# Patient Record
Sex: Female | Born: 1945 | Race: White | Hispanic: No | Marital: Married | Smoking: Never smoker
Health system: Southern US, Community
[De-identification: ages and names within clinical notes are randomized; demographics above are authoritative.]

## PROBLEM LIST (undated history)

## (undated) DIAGNOSIS — H269 Unspecified cataract: Secondary | ICD-10-CM

## (undated) DIAGNOSIS — E785 Hyperlipidemia, unspecified: Secondary | ICD-10-CM

## (undated) DIAGNOSIS — M8580 Other specified disorders of bone density and structure, unspecified site: Secondary | ICD-10-CM

## (undated) HISTORY — DX: Hyperlipidemia, unspecified: E78.5

## (undated) HISTORY — DX: Other specified disorders of bone density and structure, unspecified site: M85.80

## (undated) HISTORY — DX: Unspecified cataract: H26.9

---

## 2004-09-05 HISTORY — PX: BREAST EXCISIONAL BIOPSY: SUR124

## 2006-12-12 ENCOUNTER — Encounter: Admission: RE | Admit: 2006-12-12 | Discharge: 2006-12-12 | Payer: Self-pay | Admitting: Obstetrics and Gynecology

## 2007-12-14 ENCOUNTER — Encounter: Admission: RE | Admit: 2007-12-14 | Discharge: 2007-12-14 | Payer: Self-pay | Admitting: Obstetrics and Gynecology

## 2008-12-15 ENCOUNTER — Encounter: Admission: RE | Admit: 2008-12-15 | Discharge: 2008-12-15 | Payer: Self-pay | Admitting: Obstetrics and Gynecology

## 2009-12-16 ENCOUNTER — Encounter: Admission: RE | Admit: 2009-12-16 | Discharge: 2009-12-16 | Payer: Self-pay | Admitting: Obstetrics and Gynecology

## 2010-11-17 ENCOUNTER — Other Ambulatory Visit: Payer: Self-pay | Admitting: Obstetrics and Gynecology

## 2010-11-17 DIAGNOSIS — Z1231 Encounter for screening mammogram for malignant neoplasm of breast: Secondary | ICD-10-CM

## 2010-12-20 ENCOUNTER — Ambulatory Visit
Admission: RE | Admit: 2010-12-20 | Discharge: 2010-12-20 | Disposition: A | Discharge status: Home / Self Care | Payer: BLUE CROSS/BLUE SHIELD | Source: Ambulatory Visit | Attending: Obstetrics and Gynecology | Admitting: Obstetrics and Gynecology

## 2010-12-20 DIAGNOSIS — Z1231 Encounter for screening mammogram for malignant neoplasm of breast: Secondary | ICD-10-CM

## 2011-11-14 ENCOUNTER — Other Ambulatory Visit: Payer: Self-pay | Admitting: Obstetrics and Gynecology

## 2011-11-14 DIAGNOSIS — Z1231 Encounter for screening mammogram for malignant neoplasm of breast: Secondary | ICD-10-CM

## 2011-12-26 ENCOUNTER — Ambulatory Visit
Admission: RE | Admit: 2011-12-26 | Discharge: 2011-12-26 | Disposition: A | Discharge status: Home / Self Care | Payer: BC Managed Care – PPO | Source: Ambulatory Visit | Attending: Obstetrics and Gynecology | Admitting: Obstetrics and Gynecology

## 2011-12-26 DIAGNOSIS — Z1231 Encounter for screening mammogram for malignant neoplasm of breast: Secondary | ICD-10-CM

## 2011-12-27 ENCOUNTER — Other Ambulatory Visit: Payer: Self-pay | Admitting: Obstetrics and Gynecology

## 2011-12-27 DIAGNOSIS — R928 Other abnormal and inconclusive findings on diagnostic imaging of breast: Secondary | ICD-10-CM

## 2012-01-02 ENCOUNTER — Ambulatory Visit
Admission: RE | Admit: 2012-01-02 | Discharge: 2012-01-02 | Disposition: A | Discharge status: Home / Self Care | Payer: BC Managed Care – PPO | Source: Ambulatory Visit | Attending: Obstetrics and Gynecology | Admitting: Obstetrics and Gynecology

## 2012-01-02 DIAGNOSIS — R928 Other abnormal and inconclusive findings on diagnostic imaging of breast: Secondary | ICD-10-CM

## 2012-08-23 DIAGNOSIS — I10 Essential (primary) hypertension: Secondary | ICD-10-CM | POA: Diagnosis not present

## 2012-08-23 DIAGNOSIS — E782 Mixed hyperlipidemia: Secondary | ICD-10-CM | POA: Diagnosis not present

## 2012-12-03 ENCOUNTER — Other Ambulatory Visit: Payer: Self-pay

## 2012-12-03 DIAGNOSIS — Z1231 Encounter for screening mammogram for malignant neoplasm of breast: Secondary | ICD-10-CM

## 2012-12-03 DIAGNOSIS — I10 Essential (primary) hypertension: Secondary | ICD-10-CM | POA: Diagnosis not present

## 2012-12-03 DIAGNOSIS — Z0000 Encounter for general adult medical examination without abnormal findings: Secondary | ICD-10-CM | POA: Diagnosis not present

## 2012-12-18 DIAGNOSIS — Z23 Encounter for immunization: Secondary | ICD-10-CM | POA: Diagnosis not present

## 2013-01-08 ENCOUNTER — Ambulatory Visit
Admission: RE | Admit: 2013-01-08 | Discharge: 2013-01-08 | Disposition: A | Discharge status: Home / Self Care | Payer: Medicare Other | Source: Ambulatory Visit

## 2013-01-08 DIAGNOSIS — Z1231 Encounter for screening mammogram for malignant neoplasm of breast: Secondary | ICD-10-CM | POA: Diagnosis not present

## 2013-01-09 ENCOUNTER — Other Ambulatory Visit: Payer: Self-pay | Admitting: Obstetrics and Gynecology

## 2013-01-09 DIAGNOSIS — R928 Other abnormal and inconclusive findings on diagnostic imaging of breast: Secondary | ICD-10-CM

## 2013-01-09 DIAGNOSIS — Z01419 Encounter for gynecological examination (general) (routine) without abnormal findings: Secondary | ICD-10-CM | POA: Diagnosis not present

## 2013-01-09 DIAGNOSIS — Z780 Asymptomatic menopausal state: Secondary | ICD-10-CM | POA: Diagnosis not present

## 2013-01-23 ENCOUNTER — Ambulatory Visit
Admission: RE | Admit: 2013-01-23 | Discharge: 2013-01-23 | Disposition: A | Discharge status: Home / Self Care | Payer: Medicare Other | Source: Ambulatory Visit | Attending: Obstetrics and Gynecology | Admitting: Obstetrics and Gynecology

## 2013-01-23 DIAGNOSIS — R928 Other abnormal and inconclusive findings on diagnostic imaging of breast: Secondary | ICD-10-CM

## 2013-01-23 DIAGNOSIS — N6019 Diffuse cystic mastopathy of unspecified breast: Secondary | ICD-10-CM | POA: Diagnosis not present

## 2013-03-04 DIAGNOSIS — I10 Essential (primary) hypertension: Secondary | ICD-10-CM | POA: Diagnosis not present

## 2013-03-04 DIAGNOSIS — E782 Mixed hyperlipidemia: Secondary | ICD-10-CM | POA: Diagnosis not present

## 2013-06-04 DIAGNOSIS — I10 Essential (primary) hypertension: Secondary | ICD-10-CM | POA: Diagnosis not present

## 2013-06-24 ENCOUNTER — Other Ambulatory Visit: Payer: Self-pay | Admitting: Obstetrics and Gynecology

## 2013-06-24 DIAGNOSIS — N6310 Unspecified lump in the right breast, unspecified quadrant: Secondary | ICD-10-CM

## 2013-07-10 ENCOUNTER — Ambulatory Visit
Admission: RE | Admit: 2013-07-10 | Discharge: 2013-07-10 | Disposition: A | Discharge status: Home / Self Care | Payer: Medicare Other | Source: Ambulatory Visit | Attending: Obstetrics and Gynecology | Admitting: Obstetrics and Gynecology

## 2013-07-10 DIAGNOSIS — N6310 Unspecified lump in the right breast, unspecified quadrant: Secondary | ICD-10-CM

## 2013-07-10 DIAGNOSIS — R920 Mammographic microcalcification found on diagnostic imaging of breast: Secondary | ICD-10-CM | POA: Diagnosis not present

## 2013-08-23 DIAGNOSIS — Z7982 Long term (current) use of aspirin: Secondary | ICD-10-CM | POA: Diagnosis not present

## 2013-08-23 DIAGNOSIS — Z79899 Other long term (current) drug therapy: Secondary | ICD-10-CM | POA: Diagnosis not present

## 2013-08-23 DIAGNOSIS — M545 Low back pain: Secondary | ICD-10-CM | POA: Diagnosis not present

## 2013-08-23 DIAGNOSIS — E7800 Pure hypercholesterolemia, unspecified: Secondary | ICD-10-CM | POA: Diagnosis not present

## 2013-08-23 DIAGNOSIS — M5430 Sciatica, unspecified side: Secondary | ICD-10-CM | POA: Diagnosis not present

## 2013-08-27 DIAGNOSIS — M549 Dorsalgia, unspecified: Secondary | ICD-10-CM | POA: Diagnosis not present

## 2013-09-03 DIAGNOSIS — E782 Mixed hyperlipidemia: Secondary | ICD-10-CM | POA: Diagnosis not present

## 2013-09-03 DIAGNOSIS — I10 Essential (primary) hypertension: Secondary | ICD-10-CM | POA: Diagnosis not present

## 2013-12-02 DIAGNOSIS — E782 Mixed hyperlipidemia: Secondary | ICD-10-CM | POA: Diagnosis not present

## 2013-12-11 ENCOUNTER — Other Ambulatory Visit: Payer: Self-pay

## 2013-12-11 ENCOUNTER — Other Ambulatory Visit: Payer: Self-pay | Admitting: Obstetrics and Gynecology

## 2013-12-11 DIAGNOSIS — N6310 Unspecified lump in the right breast, unspecified quadrant: Secondary | ICD-10-CM

## 2014-01-10 DIAGNOSIS — Z01419 Encounter for gynecological examination (general) (routine) without abnormal findings: Secondary | ICD-10-CM | POA: Diagnosis not present

## 2014-01-20 ENCOUNTER — Ambulatory Visit
Admission: RE | Admit: 2014-01-20 | Discharge: 2014-01-20 | Disposition: A | Discharge status: Home / Self Care | Payer: Medicare Other | Source: Ambulatory Visit | Attending: Obstetrics and Gynecology | Admitting: Obstetrics and Gynecology

## 2014-01-20 ENCOUNTER — Other Ambulatory Visit: Payer: Self-pay | Admitting: Obstetrics and Gynecology

## 2014-01-20 ENCOUNTER — Encounter (INDEPENDENT_AMBULATORY_CARE_PROVIDER_SITE_OTHER): Payer: Self-pay

## 2014-01-20 DIAGNOSIS — N6310 Unspecified lump in the right breast, unspecified quadrant: Secondary | ICD-10-CM

## 2014-01-20 DIAGNOSIS — R922 Inconclusive mammogram: Secondary | ICD-10-CM | POA: Diagnosis not present

## 2014-03-04 DIAGNOSIS — Z0000 Encounter for general adult medical examination without abnormal findings: Secondary | ICD-10-CM | POA: Diagnosis not present

## 2014-03-04 DIAGNOSIS — E782 Mixed hyperlipidemia: Secondary | ICD-10-CM | POA: Diagnosis not present

## 2014-06-03 DIAGNOSIS — E782 Mixed hyperlipidemia: Secondary | ICD-10-CM | POA: Diagnosis not present

## 2014-09-11 DIAGNOSIS — Z23 Encounter for immunization: Secondary | ICD-10-CM | POA: Diagnosis not present

## 2014-10-03 DIAGNOSIS — I10 Essential (primary) hypertension: Secondary | ICD-10-CM | POA: Diagnosis not present

## 2014-10-03 DIAGNOSIS — E782 Mixed hyperlipidemia: Secondary | ICD-10-CM | POA: Diagnosis not present

## 2014-12-17 ENCOUNTER — Other Ambulatory Visit: Payer: Self-pay

## 2014-12-17 DIAGNOSIS — Z1231 Encounter for screening mammogram for malignant neoplasm of breast: Secondary | ICD-10-CM

## 2015-01-21 DIAGNOSIS — R87619 Unspecified abnormal cytological findings in specimens from cervix uteri: Secondary | ICD-10-CM | POA: Diagnosis not present

## 2015-01-21 DIAGNOSIS — Z6825 Body mass index (BMI) 25.0-25.9, adult: Secondary | ICD-10-CM | POA: Diagnosis not present

## 2015-01-21 DIAGNOSIS — Z01419 Encounter for gynecological examination (general) (routine) without abnormal findings: Secondary | ICD-10-CM | POA: Diagnosis not present

## 2015-01-26 ENCOUNTER — Ambulatory Visit: Payer: Medicare Other

## 2015-01-26 ENCOUNTER — Ambulatory Visit
Admission: RE | Admit: 2015-01-26 | Discharge: 2015-01-26 | Disposition: A | Discharge status: Home / Self Care | Payer: Medicare Other | Source: Ambulatory Visit

## 2015-01-26 DIAGNOSIS — Z1231 Encounter for screening mammogram for malignant neoplasm of breast: Secondary | ICD-10-CM

## 2015-02-09 DIAGNOSIS — L570 Actinic keratosis: Secondary | ICD-10-CM | POA: Diagnosis not present

## 2015-02-09 DIAGNOSIS — E782 Mixed hyperlipidemia: Secondary | ICD-10-CM | POA: Diagnosis not present

## 2015-02-09 DIAGNOSIS — I10 Essential (primary) hypertension: Secondary | ICD-10-CM | POA: Diagnosis not present

## 2015-06-08 DIAGNOSIS — Z1389 Encounter for screening for other disorder: Secondary | ICD-10-CM | POA: Diagnosis not present

## 2015-06-08 DIAGNOSIS — I10 Essential (primary) hypertension: Secondary | ICD-10-CM | POA: Diagnosis not present

## 2015-06-08 DIAGNOSIS — Z0000 Encounter for general adult medical examination without abnormal findings: Secondary | ICD-10-CM | POA: Diagnosis not present

## 2015-06-08 DIAGNOSIS — E782 Mixed hyperlipidemia: Secondary | ICD-10-CM | POA: Diagnosis not present

## 2015-10-09 DIAGNOSIS — I10 Essential (primary) hypertension: Secondary | ICD-10-CM | POA: Diagnosis not present

## 2015-10-09 DIAGNOSIS — E782 Mixed hyperlipidemia: Secondary | ICD-10-CM | POA: Diagnosis not present

## 2015-12-28 ENCOUNTER — Other Ambulatory Visit: Payer: Self-pay

## 2015-12-28 DIAGNOSIS — Z1231 Encounter for screening mammogram for malignant neoplasm of breast: Secondary | ICD-10-CM

## 2016-01-27 ENCOUNTER — Ambulatory Visit
Admission: RE | Admit: 2016-01-27 | Discharge: 2016-01-27 | Disposition: A | Discharge status: Home / Self Care | Payer: Medicare Other | Source: Ambulatory Visit

## 2016-01-27 DIAGNOSIS — Z1231 Encounter for screening mammogram for malignant neoplasm of breast: Secondary | ICD-10-CM

## 2016-02-05 DIAGNOSIS — I10 Essential (primary) hypertension: Secondary | ICD-10-CM | POA: Diagnosis not present

## 2016-02-05 DIAGNOSIS — E782 Mixed hyperlipidemia: Secondary | ICD-10-CM | POA: Diagnosis not present

## 2016-06-06 DIAGNOSIS — E782 Mixed hyperlipidemia: Secondary | ICD-10-CM | POA: Diagnosis not present

## 2016-06-06 DIAGNOSIS — I10 Essential (primary) hypertension: Secondary | ICD-10-CM | POA: Diagnosis not present

## 2016-06-06 DIAGNOSIS — Z1389 Encounter for screening for other disorder: Secondary | ICD-10-CM | POA: Diagnosis not present

## 2016-06-06 DIAGNOSIS — Z0000 Encounter for general adult medical examination without abnormal findings: Secondary | ICD-10-CM | POA: Diagnosis not present

## 2016-10-07 DIAGNOSIS — Z13220 Encounter for screening for lipoid disorders: Secondary | ICD-10-CM | POA: Diagnosis not present

## 2016-10-07 DIAGNOSIS — I10 Essential (primary) hypertension: Secondary | ICD-10-CM | POA: Diagnosis not present

## 2016-10-07 DIAGNOSIS — E782 Mixed hyperlipidemia: Secondary | ICD-10-CM | POA: Diagnosis not present

## 2016-10-07 DIAGNOSIS — Z131 Encounter for screening for diabetes mellitus: Secondary | ICD-10-CM | POA: Diagnosis not present

## 2016-10-07 DIAGNOSIS — Z0000 Encounter for general adult medical examination without abnormal findings: Secondary | ICD-10-CM | POA: Diagnosis not present

## 2016-12-16 ENCOUNTER — Other Ambulatory Visit: Payer: Self-pay | Admitting: Obstetrics and Gynecology

## 2016-12-16 DIAGNOSIS — Z1231 Encounter for screening mammogram for malignant neoplasm of breast: Secondary | ICD-10-CM

## 2017-01-05 DIAGNOSIS — I10 Essential (primary) hypertension: Secondary | ICD-10-CM | POA: Diagnosis not present

## 2017-01-05 DIAGNOSIS — E782 Mixed hyperlipidemia: Secondary | ICD-10-CM | POA: Diagnosis not present

## 2017-01-25 DIAGNOSIS — Z01419 Encounter for gynecological examination (general) (routine) without abnormal findings: Secondary | ICD-10-CM | POA: Diagnosis not present

## 2017-01-25 DIAGNOSIS — Z6825 Body mass index (BMI) 25.0-25.9, adult: Secondary | ICD-10-CM | POA: Diagnosis not present

## 2017-02-03 ENCOUNTER — Ambulatory Visit
Admission: RE | Admit: 2017-02-03 | Discharge: 2017-02-03 | Disposition: A | Discharge status: Home / Self Care | Payer: Medicare Other | Source: Ambulatory Visit | Attending: Obstetrics and Gynecology | Admitting: Obstetrics and Gynecology

## 2017-02-03 DIAGNOSIS — Z1231 Encounter for screening mammogram for malignant neoplasm of breast: Secondary | ICD-10-CM

## 2017-05-09 DIAGNOSIS — E782 Mixed hyperlipidemia: Secondary | ICD-10-CM | POA: Diagnosis not present

## 2017-05-09 DIAGNOSIS — I10 Essential (primary) hypertension: Secondary | ICD-10-CM | POA: Diagnosis not present

## 2017-05-09 DIAGNOSIS — Z79899 Other long term (current) drug therapy: Secondary | ICD-10-CM | POA: Diagnosis not present

## 2017-09-06 DIAGNOSIS — I10 Essential (primary) hypertension: Secondary | ICD-10-CM | POA: Diagnosis not present

## 2017-09-06 DIAGNOSIS — E782 Mixed hyperlipidemia: Secondary | ICD-10-CM | POA: Diagnosis not present

## 2018-01-03 ENCOUNTER — Other Ambulatory Visit: Payer: Self-pay | Admitting: Obstetrics and Gynecology

## 2018-01-03 DIAGNOSIS — Z1231 Encounter for screening mammogram for malignant neoplasm of breast: Secondary | ICD-10-CM

## 2018-01-04 DIAGNOSIS — Z79899 Other long term (current) drug therapy: Secondary | ICD-10-CM | POA: Diagnosis not present

## 2018-01-04 DIAGNOSIS — E782 Mixed hyperlipidemia: Secondary | ICD-10-CM | POA: Diagnosis not present

## 2018-01-04 DIAGNOSIS — I10 Essential (primary) hypertension: Secondary | ICD-10-CM | POA: Diagnosis not present

## 2018-02-05 ENCOUNTER — Ambulatory Visit
Admission: RE | Admit: 2018-02-05 | Discharge: 2018-02-05 | Disposition: A | Discharge status: Home / Self Care | Payer: Medicare Other | Source: Ambulatory Visit | Attending: Obstetrics and Gynecology | Admitting: Obstetrics and Gynecology

## 2018-02-05 DIAGNOSIS — Z1231 Encounter for screening mammogram for malignant neoplasm of breast: Secondary | ICD-10-CM

## 2018-05-08 DIAGNOSIS — Z1389 Encounter for screening for other disorder: Secondary | ICD-10-CM | POA: Diagnosis not present

## 2018-05-08 DIAGNOSIS — I10 Essential (primary) hypertension: Secondary | ICD-10-CM | POA: Diagnosis not present

## 2018-05-08 DIAGNOSIS — E782 Mixed hyperlipidemia: Secondary | ICD-10-CM | POA: Diagnosis not present

## 2018-05-08 DIAGNOSIS — Z0000 Encounter for general adult medical examination without abnormal findings: Secondary | ICD-10-CM | POA: Diagnosis not present

## 2018-06-07 DIAGNOSIS — M8588 Other specified disorders of bone density and structure, other site: Secondary | ICD-10-CM | POA: Diagnosis not present

## 2018-06-07 DIAGNOSIS — M810 Age-related osteoporosis without current pathological fracture: Secondary | ICD-10-CM | POA: Diagnosis not present

## 2018-08-07 DIAGNOSIS — M810 Age-related osteoporosis without current pathological fracture: Secondary | ICD-10-CM | POA: Diagnosis not present

## 2018-08-07 DIAGNOSIS — E782 Mixed hyperlipidemia: Secondary | ICD-10-CM | POA: Diagnosis not present

## 2018-08-07 DIAGNOSIS — I10 Essential (primary) hypertension: Secondary | ICD-10-CM | POA: Diagnosis not present

## 2018-10-16 DIAGNOSIS — H1031 Unspecified acute conjunctivitis, right eye: Secondary | ICD-10-CM | POA: Diagnosis not present

## 2018-11-07 DIAGNOSIS — E7849 Other hyperlipidemia: Secondary | ICD-10-CM | POA: Diagnosis not present

## 2018-11-07 DIAGNOSIS — I10 Essential (primary) hypertension: Secondary | ICD-10-CM | POA: Diagnosis not present

## 2018-11-07 DIAGNOSIS — Z79899 Other long term (current) drug therapy: Secondary | ICD-10-CM | POA: Diagnosis not present

## 2018-11-07 DIAGNOSIS — E782 Mixed hyperlipidemia: Secondary | ICD-10-CM | POA: Diagnosis not present

## 2018-11-07 DIAGNOSIS — H1031 Unspecified acute conjunctivitis, right eye: Secondary | ICD-10-CM | POA: Diagnosis not present

## 2018-11-08 DIAGNOSIS — H10501 Unspecified blepharoconjunctivitis, right eye: Secondary | ICD-10-CM | POA: Diagnosis not present

## 2019-01-16 ENCOUNTER — Other Ambulatory Visit: Payer: Self-pay | Admitting: Obstetrics and Gynecology

## 2019-01-16 DIAGNOSIS — Z1231 Encounter for screening mammogram for malignant neoplasm of breast: Secondary | ICD-10-CM

## 2019-02-07 DIAGNOSIS — E7849 Other hyperlipidemia: Secondary | ICD-10-CM | POA: Diagnosis not present

## 2019-02-07 DIAGNOSIS — I10 Essential (primary) hypertension: Secondary | ICD-10-CM | POA: Diagnosis not present

## 2019-02-07 DIAGNOSIS — M818 Other osteoporosis without current pathological fracture: Secondary | ICD-10-CM | POA: Diagnosis not present

## 2019-03-12 ENCOUNTER — Ambulatory Visit
Admission: RE | Admit: 2019-03-12 | Discharge: 2019-03-12 | Disposition: A | Discharge status: Home / Self Care | Payer: Medicare Other | Source: Ambulatory Visit | Attending: Obstetrics and Gynecology | Admitting: Obstetrics and Gynecology

## 2019-03-12 ENCOUNTER — Other Ambulatory Visit: Payer: Self-pay

## 2019-03-12 DIAGNOSIS — Z1231 Encounter for screening mammogram for malignant neoplasm of breast: Secondary | ICD-10-CM

## 2019-05-20 DIAGNOSIS — E7849 Other hyperlipidemia: Secondary | ICD-10-CM | POA: Diagnosis not present

## 2019-05-20 DIAGNOSIS — M818 Other osteoporosis without current pathological fracture: Secondary | ICD-10-CM | POA: Diagnosis not present

## 2019-05-20 DIAGNOSIS — I10 Essential (primary) hypertension: Secondary | ICD-10-CM | POA: Diagnosis not present

## 2019-05-20 DIAGNOSIS — Z79899 Other long term (current) drug therapy: Secondary | ICD-10-CM | POA: Diagnosis not present

## 2019-11-11 DIAGNOSIS — H52223 Regular astigmatism, bilateral: Secondary | ICD-10-CM | POA: Diagnosis not present

## 2019-11-11 DIAGNOSIS — H5203 Hypermetropia, bilateral: Secondary | ICD-10-CM | POA: Diagnosis not present

## 2019-11-11 DIAGNOSIS — H2513 Age-related nuclear cataract, bilateral: Secondary | ICD-10-CM | POA: Diagnosis not present

## 2019-11-11 DIAGNOSIS — H524 Presbyopia: Secondary | ICD-10-CM | POA: Diagnosis not present

## 2019-11-21 DIAGNOSIS — Z0000 Encounter for general adult medical examination without abnormal findings: Secondary | ICD-10-CM | POA: Diagnosis not present

## 2019-11-21 DIAGNOSIS — Z1331 Encounter for screening for depression: Secondary | ICD-10-CM | POA: Diagnosis not present

## 2019-11-21 DIAGNOSIS — M818 Other osteoporosis without current pathological fracture: Secondary | ICD-10-CM | POA: Diagnosis not present

## 2019-11-21 DIAGNOSIS — E7849 Other hyperlipidemia: Secondary | ICD-10-CM | POA: Diagnosis not present

## 2019-11-21 DIAGNOSIS — I10 Essential (primary) hypertension: Secondary | ICD-10-CM | POA: Diagnosis not present

## 2020-02-04 ENCOUNTER — Other Ambulatory Visit: Payer: Self-pay | Admitting: Obstetrics and Gynecology

## 2020-02-04 DIAGNOSIS — Z1231 Encounter for screening mammogram for malignant neoplasm of breast: Secondary | ICD-10-CM

## 2020-03-16 ENCOUNTER — Other Ambulatory Visit: Payer: Self-pay

## 2020-03-16 ENCOUNTER — Ambulatory Visit
Admission: RE | Admit: 2020-03-16 | Discharge: 2020-03-16 | Disposition: A | Discharge status: Home / Self Care | Payer: Medicare Other | Source: Ambulatory Visit | Attending: Obstetrics and Gynecology | Admitting: Obstetrics and Gynecology

## 2020-03-16 DIAGNOSIS — Z1231 Encounter for screening mammogram for malignant neoplasm of breast: Secondary | ICD-10-CM

## 2020-03-19 ENCOUNTER — Other Ambulatory Visit: Payer: Self-pay | Admitting: Obstetrics and Gynecology

## 2020-03-19 DIAGNOSIS — R928 Other abnormal and inconclusive findings on diagnostic imaging of breast: Secondary | ICD-10-CM

## 2020-03-30 ENCOUNTER — Ambulatory Visit
Admission: RE | Admit: 2020-03-30 | Discharge: 2020-03-30 | Disposition: A | Discharge status: Home / Self Care | Payer: Medicare Other | Source: Ambulatory Visit | Attending: Obstetrics and Gynecology | Admitting: Obstetrics and Gynecology

## 2020-03-30 ENCOUNTER — Other Ambulatory Visit: Payer: Self-pay

## 2020-03-30 ENCOUNTER — Ambulatory Visit: Payer: Medicare Other

## 2020-03-30 DIAGNOSIS — R928 Other abnormal and inconclusive findings on diagnostic imaging of breast: Secondary | ICD-10-CM

## 2020-06-01 DIAGNOSIS — E7849 Other hyperlipidemia: Secondary | ICD-10-CM | POA: Diagnosis not present

## 2020-06-01 DIAGNOSIS — M818 Other osteoporosis without current pathological fracture: Secondary | ICD-10-CM | POA: Diagnosis not present

## 2020-09-08 DIAGNOSIS — M818 Other osteoporosis without current pathological fracture: Secondary | ICD-10-CM | POA: Diagnosis not present

## 2020-09-08 DIAGNOSIS — E7849 Other hyperlipidemia: Secondary | ICD-10-CM | POA: Diagnosis not present

## 2020-12-07 DIAGNOSIS — Z0000 Encounter for general adult medical examination without abnormal findings: Secondary | ICD-10-CM | POA: Diagnosis not present

## 2020-12-07 DIAGNOSIS — Z1331 Encounter for screening for depression: Secondary | ICD-10-CM | POA: Diagnosis not present

## 2020-12-07 DIAGNOSIS — E7849 Other hyperlipidemia: Secondary | ICD-10-CM | POA: Diagnosis not present

## 2020-12-07 DIAGNOSIS — M818 Other osteoporosis without current pathological fracture: Secondary | ICD-10-CM | POA: Diagnosis not present

## 2021-02-16 DIAGNOSIS — H524 Presbyopia: Secondary | ICD-10-CM | POA: Diagnosis not present

## 2021-02-16 DIAGNOSIS — H2513 Age-related nuclear cataract, bilateral: Secondary | ICD-10-CM | POA: Diagnosis not present

## 2021-02-16 DIAGNOSIS — H5203 Hypermetropia, bilateral: Secondary | ICD-10-CM | POA: Diagnosis not present

## 2021-03-15 DIAGNOSIS — E7849 Other hyperlipidemia: Secondary | ICD-10-CM | POA: Diagnosis not present

## 2021-03-15 DIAGNOSIS — M818 Other osteoporosis without current pathological fracture: Secondary | ICD-10-CM | POA: Diagnosis not present

## 2021-03-15 DIAGNOSIS — I10 Essential (primary) hypertension: Secondary | ICD-10-CM | POA: Diagnosis not present

## 2021-03-20 IMAGING — MG MM DIGITAL DIAGNOSTIC UNILAT*L* W/ TOMO W/ CAD
4 series · 4 of 12 positions shown · non-contrast
Comparison: Previous exam(s).

CLINICAL DATA: Screening recall for a possible architectural
distortion in the left breast.

EXAM:
DIGITAL DIAGNOSTIC UNILATERAL LEFT MAMMOGRAM WITH TOMO AND CAD

[L MLO synth-2D]
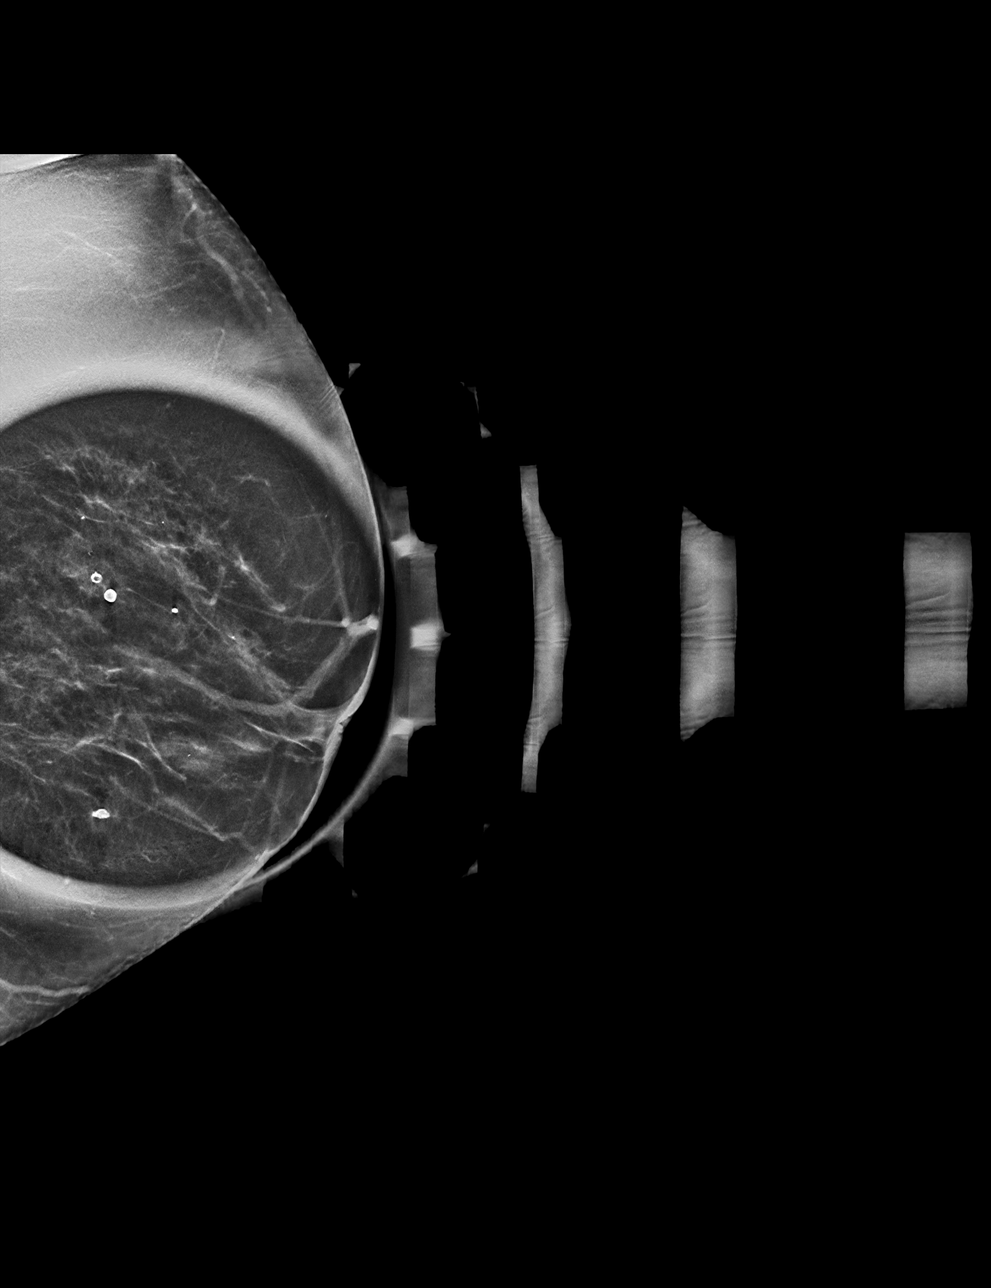

[L CC synth-2D]
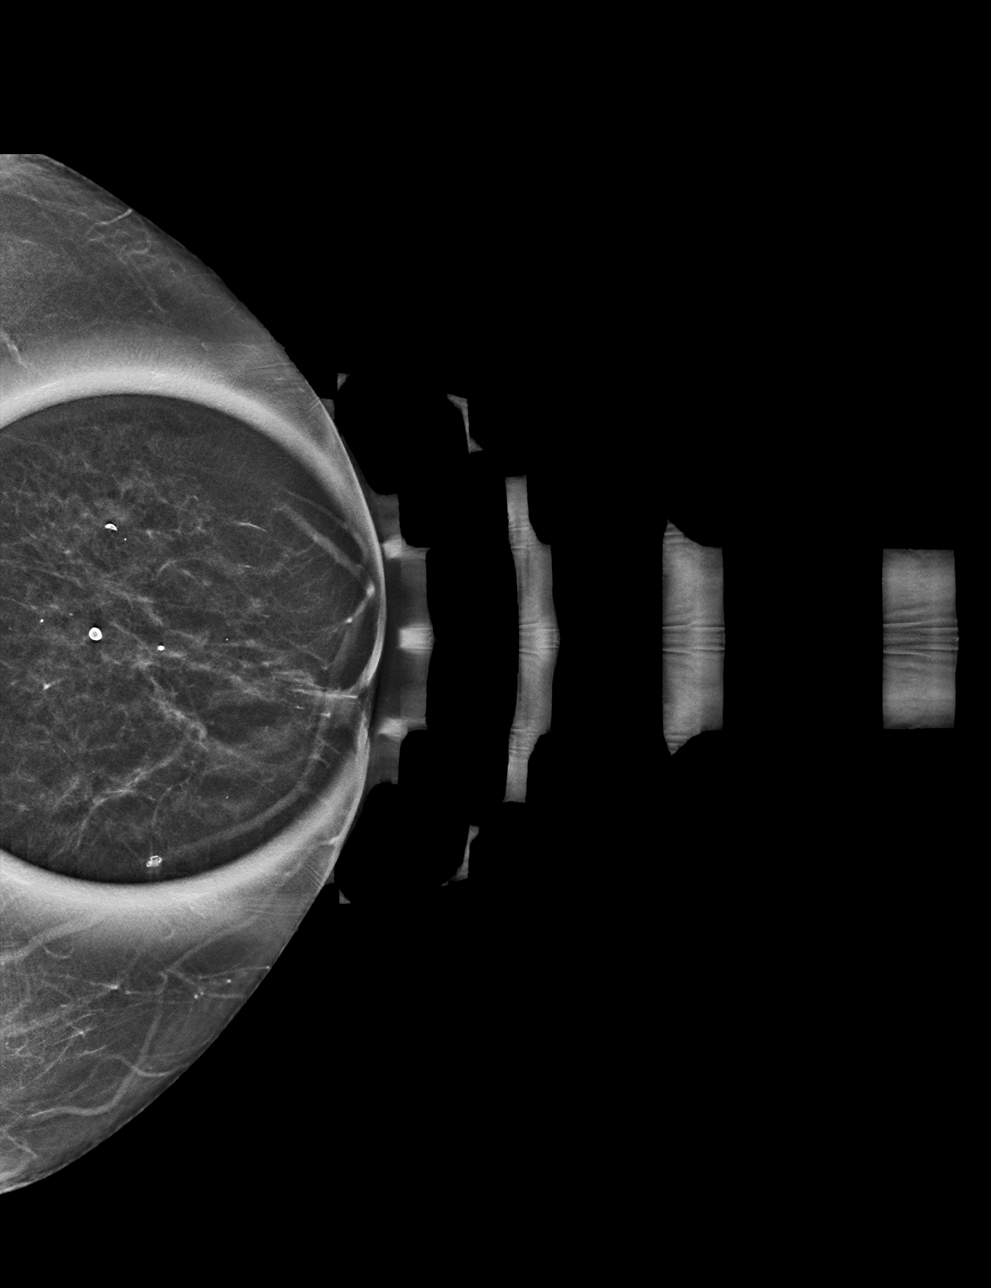

[L MLO tomo · tomo slice 25/49.0]
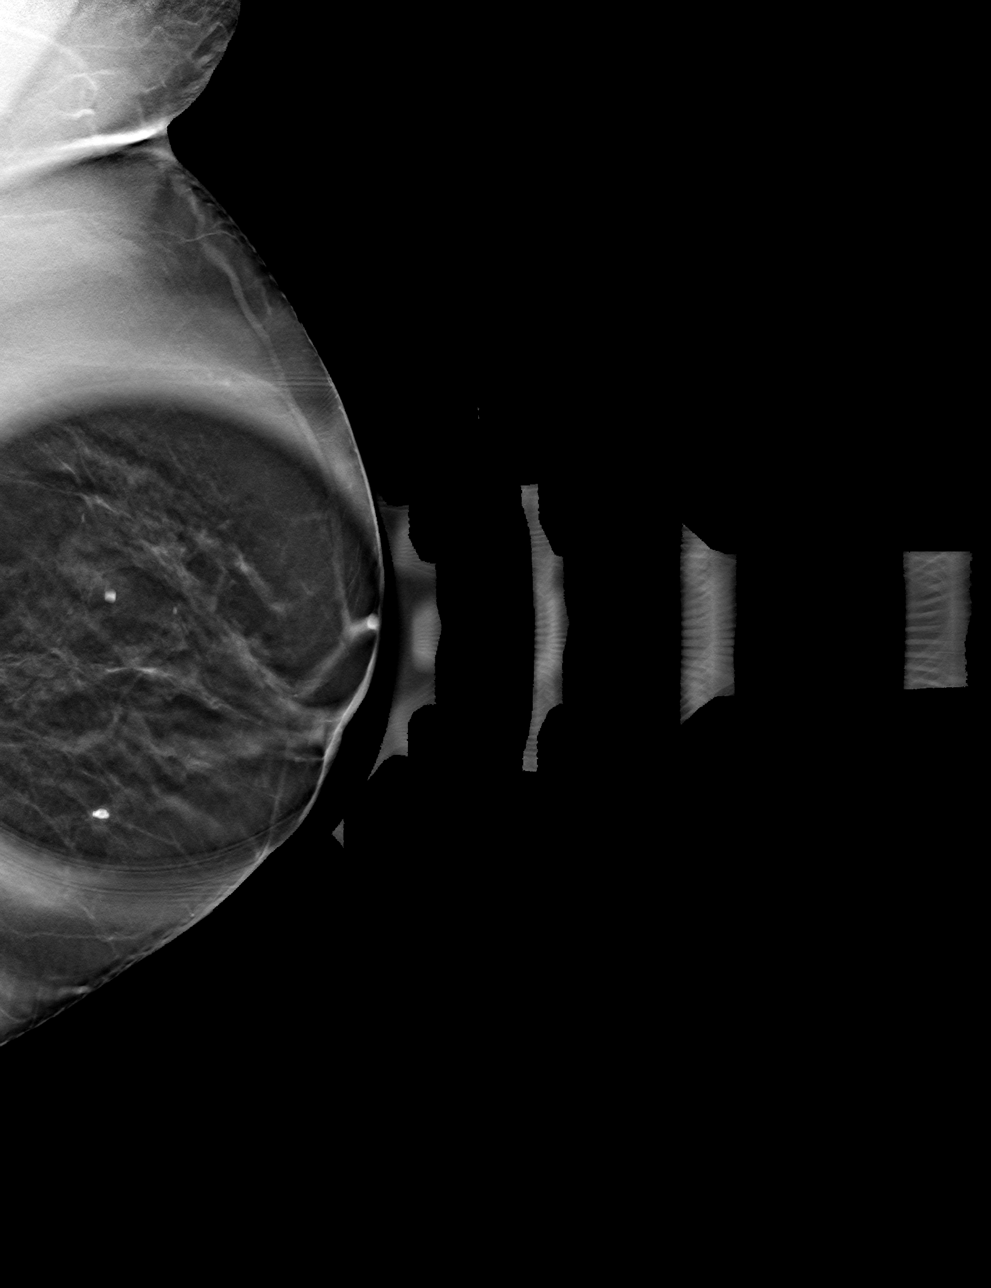

[L CC tomo · tomo slice 21/41.0]
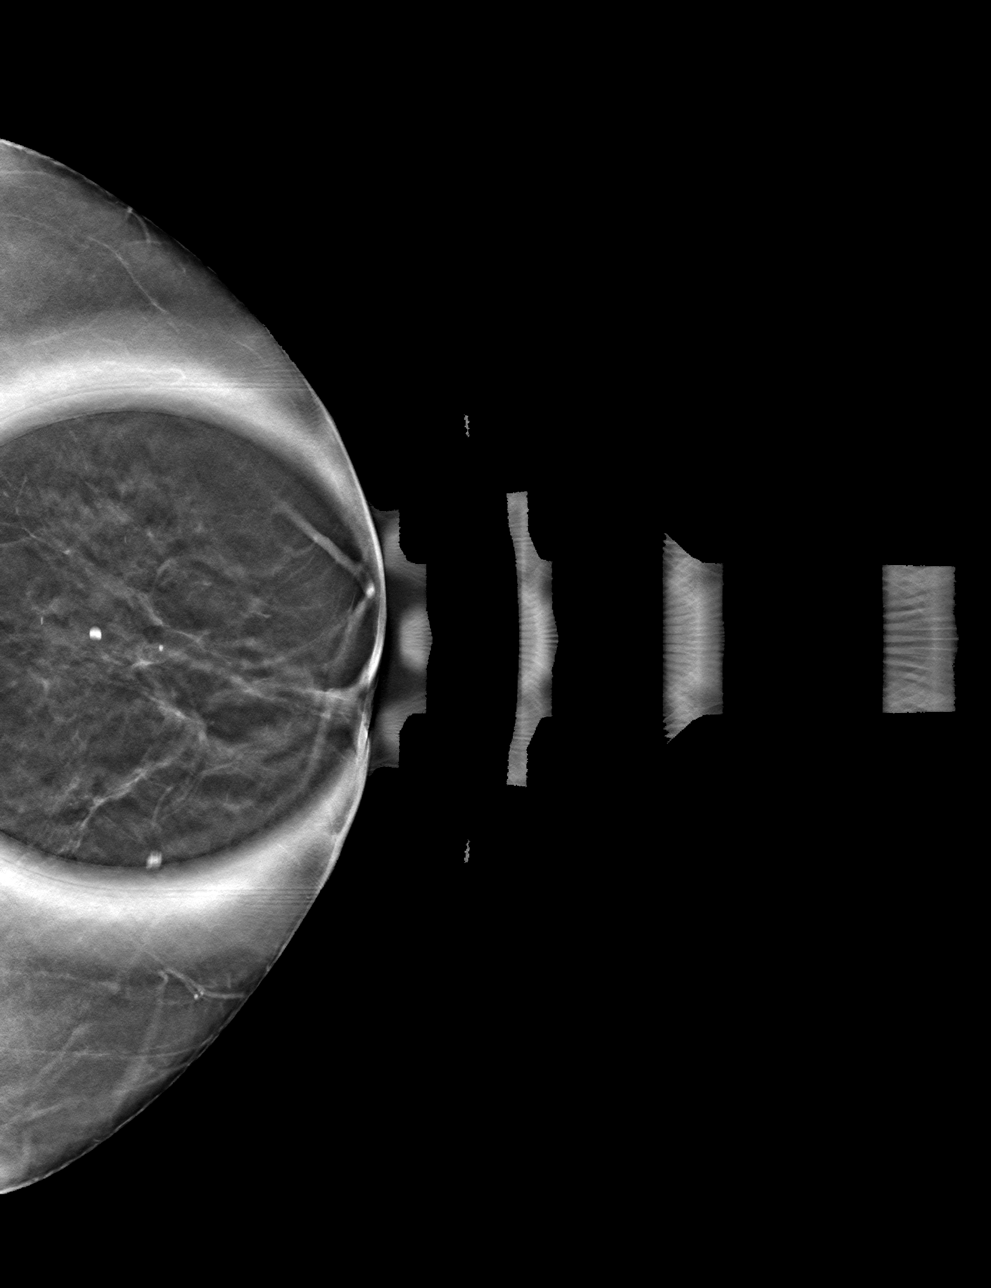

[4 of 12 positions shown; findings below may reference images not displayed]

ACR Breast Density Category b: There are scattered areas of
fibroglandular density.
FINDINGS: The possible architectural distortion noted on the current screening
study disperses with spot compression imaging, consistent with
normal superimposed fibroglandular tissue. There is no underlying
mass, true distortion or significant asymmetry. There are no
suspicious calcifications.

Mammographic images were processed with CAD.
IMPRESSION: Negative exam.  No evidence of breast malignancy.

RECOMMENDATION:
Screening mammogram in one year.(Code:[id number])

I have discussed the findings and recommendations with the patient.
If applicable, a reminder letter will be sent to the patient
regarding the next appointment.

BI-RADS CATEGORY  1: Negative.

## 2021-05-11 DIAGNOSIS — Z1231 Encounter for screening mammogram for malignant neoplasm of breast: Secondary | ICD-10-CM | POA: Diagnosis not present

## 2021-06-14 DIAGNOSIS — M818 Other osteoporosis without current pathological fracture: Secondary | ICD-10-CM | POA: Diagnosis not present

## 2021-06-14 DIAGNOSIS — E7849 Other hyperlipidemia: Secondary | ICD-10-CM | POA: Diagnosis not present

## 2021-06-14 DIAGNOSIS — I10 Essential (primary) hypertension: Secondary | ICD-10-CM | POA: Diagnosis not present

## 2021-06-29 DIAGNOSIS — M8589 Other specified disorders of bone density and structure, multiple sites: Secondary | ICD-10-CM | POA: Diagnosis not present

## 2021-06-29 DIAGNOSIS — M810 Age-related osteoporosis without current pathological fracture: Secondary | ICD-10-CM | POA: Diagnosis not present

## 2021-09-13 DIAGNOSIS — M818 Other osteoporosis without current pathological fracture: Secondary | ICD-10-CM | POA: Diagnosis not present

## 2021-09-13 DIAGNOSIS — I10 Essential (primary) hypertension: Secondary | ICD-10-CM | POA: Diagnosis not present

## 2021-09-13 DIAGNOSIS — E7849 Other hyperlipidemia: Secondary | ICD-10-CM | POA: Diagnosis not present

## 2021-12-13 DIAGNOSIS — Z0000 Encounter for general adult medical examination without abnormal findings: Secondary | ICD-10-CM | POA: Diagnosis not present

## 2021-12-13 DIAGNOSIS — E7849 Other hyperlipidemia: Secondary | ICD-10-CM | POA: Diagnosis not present

## 2021-12-13 DIAGNOSIS — M818 Other osteoporosis without current pathological fracture: Secondary | ICD-10-CM | POA: Diagnosis not present

## 2021-12-13 DIAGNOSIS — I10 Essential (primary) hypertension: Secondary | ICD-10-CM | POA: Diagnosis not present

## 2022-02-03 DIAGNOSIS — I2699 Other pulmonary embolism without acute cor pulmonale: Secondary | ICD-10-CM

## 2022-02-03 HISTORY — DX: Other pulmonary embolism without acute cor pulmonale: I26.99

## 2022-02-14 DIAGNOSIS — R0602 Shortness of breath: Secondary | ICD-10-CM | POA: Diagnosis not present

## 2022-02-14 DIAGNOSIS — R0600 Dyspnea, unspecified: Secondary | ICD-10-CM | POA: Diagnosis not present

## 2022-02-14 DIAGNOSIS — Z0000 Encounter for general adult medical examination without abnormal findings: Secondary | ICD-10-CM | POA: Diagnosis not present

## 2022-02-14 DIAGNOSIS — J40 Bronchitis, not specified as acute or chronic: Secondary | ICD-10-CM | POA: Diagnosis not present

## 2022-02-14 DIAGNOSIS — E7849 Other hyperlipidemia: Secondary | ICD-10-CM | POA: Diagnosis not present

## 2022-03-03 DIAGNOSIS — J90 Pleural effusion, not elsewhere classified: Secondary | ICD-10-CM | POA: Diagnosis not present

## 2022-03-03 DIAGNOSIS — R0602 Shortness of breath: Secondary | ICD-10-CM | POA: Diagnosis not present

## 2022-03-03 DIAGNOSIS — I700 Atherosclerosis of aorta: Secondary | ICD-10-CM | POA: Diagnosis not present

## 2022-03-03 DIAGNOSIS — I2510 Atherosclerotic heart disease of native coronary artery without angina pectoris: Secondary | ICD-10-CM | POA: Diagnosis not present

## 2022-03-03 DIAGNOSIS — I2699 Other pulmonary embolism without acute cor pulmonale: Secondary | ICD-10-CM | POA: Diagnosis not present

## 2022-03-03 DIAGNOSIS — R911 Solitary pulmonary nodule: Secondary | ICD-10-CM | POA: Diagnosis not present

## 2022-03-03 DIAGNOSIS — I2584 Coronary atherosclerosis due to calcified coronary lesion: Secondary | ICD-10-CM | POA: Diagnosis not present

## 2022-03-03 DIAGNOSIS — R9389 Abnormal findings on diagnostic imaging of other specified body structures: Secondary | ICD-10-CM | POA: Diagnosis not present

## 2022-03-14 DIAGNOSIS — I82A13 Acute embolism and thrombosis of axillary vein, bilateral: Secondary | ICD-10-CM | POA: Diagnosis not present

## 2022-03-14 DIAGNOSIS — I10 Essential (primary) hypertension: Secondary | ICD-10-CM | POA: Diagnosis not present

## 2022-03-14 DIAGNOSIS — I2699 Other pulmonary embolism without acute cor pulmonale: Secondary | ICD-10-CM | POA: Diagnosis not present

## 2022-03-14 DIAGNOSIS — Z0000 Encounter for general adult medical examination without abnormal findings: Secondary | ICD-10-CM | POA: Diagnosis not present

## 2022-05-13 DIAGNOSIS — Z1231 Encounter for screening mammogram for malignant neoplasm of breast: Secondary | ICD-10-CM | POA: Diagnosis not present

## 2022-06-13 DIAGNOSIS — E7849 Other hyperlipidemia: Secondary | ICD-10-CM | POA: Diagnosis not present

## 2022-06-13 DIAGNOSIS — I2699 Other pulmonary embolism without acute cor pulmonale: Secondary | ICD-10-CM | POA: Diagnosis not present

## 2022-06-13 DIAGNOSIS — I10 Essential (primary) hypertension: Secondary | ICD-10-CM | POA: Diagnosis not present

## 2022-06-23 ENCOUNTER — Encounter: Payer: Self-pay | Admitting: *Deleted

## 2022-06-24 DIAGNOSIS — R1084 Generalized abdominal pain: Secondary | ICD-10-CM | POA: Diagnosis not present

## 2022-06-24 DIAGNOSIS — K828 Other specified diseases of gallbladder: Secondary | ICD-10-CM | POA: Diagnosis not present

## 2022-06-24 DIAGNOSIS — R9389 Abnormal findings on diagnostic imaging of other specified body structures: Secondary | ICD-10-CM | POA: Diagnosis not present

## 2022-06-24 DIAGNOSIS — I700 Atherosclerosis of aorta: Secondary | ICD-10-CM | POA: Diagnosis not present

## 2022-07-13 DIAGNOSIS — R9389 Abnormal findings on diagnostic imaging of other specified body structures: Secondary | ICD-10-CM | POA: Diagnosis not present

## 2022-07-13 DIAGNOSIS — R935 Abnormal findings on diagnostic imaging of other abdominal regions, including retroperitoneum: Secondary | ICD-10-CM | POA: Diagnosis not present

## 2022-07-27 ENCOUNTER — Encounter: Payer: Self-pay | Admitting: Gastroenterology

## 2022-07-27 ENCOUNTER — Encounter: Payer: Self-pay | Admitting: *Deleted

## 2022-07-27 ENCOUNTER — Ambulatory Visit (INDEPENDENT_AMBULATORY_CARE_PROVIDER_SITE_OTHER): Payer: Medicare Other | Admitting: Gastroenterology

## 2022-07-27 VITALS — BP 111/64 | HR 97 | Temp 98.4°F | Ht 61.5 in | Wt 141.8 lb

## 2022-07-27 DIAGNOSIS — Z1211 Encounter for screening for malignant neoplasm of colon: Secondary | ICD-10-CM | POA: Diagnosis not present

## 2022-07-27 DIAGNOSIS — R9389 Abnormal findings on diagnostic imaging of other specified body structures: Secondary | ICD-10-CM | POA: Insufficient documentation

## 2022-07-27 NOTE — Patient Instructions (Signed)
Please have blood work done at [facility].  We are arranging a special ultrasound of your liver to check for stiffness/scarring.  We are arranging a colonoscopy in the future. If you have scarring of the liver, we will do an endoscopy at that time, too!  We are asking to stop Xarelto 48 hours prior to the procedure.   It was a pleasure to see you today. I want to create trusting relationships with patients to provide genuine, compassionate, and quality care. I value your feedback. If you receive a survey regarding your visit,  I greatly appreciate you taking time to fill this out.   Annitta Needs, PhD, ANP-BC [facility]

## 2022-07-27 NOTE — Progress Notes (Signed)
Gastroenterology Office Note    Referring Provider: Neale Burly, MD Primary Care Physician:  Neale Burly, MD  Primary GI: Dr. Abbey Chatters    Chief Complaint   Chief Complaint  Patient presents with   Colon Cancer Screening    New patient. Referred for TCS due to being on blood thinner. Last TCS per patient was 2005 in [place].      History of Present Illness   Julie Melton is a 76 y.o. female presenting today at the request of Hasanaj, Samul Dada, MD due to need for screening colonoscopy. She is presenting in the office today as she is on Xarelto, with history of PE in June 2023. Unclear any provoking factors.   Notably, she also had an outside [place] in Oct 2023 with subtle nodularity of the liver. No known history of liver disease. No FH of liver disease, colon cancer, polyps. No personal history of polyps. Last colonoscopy approximately 2005.   No abdominal pain, N/V, changes in bowel habits, constipation, diarrhea, overt GI bleeding, GERD, dysphagia, unexplained weight loss, lack of appetite, unexplained weight gain. No jaundice or pruritus. NO ETOH or drug use. No recent labs to review.     Past Medical History:  Diagnosis Date   Hyperlipemia    Osteopenia    Pulmonary embolism ([place]) 02/2022    Past Surgical History:  Procedure Laterality Date   BREAST EXCISIONAL BIOPSY Right 2006    Current Outpatient Medications  Medication Sig Dispense Refill   alendronate (FOSAMAX) 70 MG tablet Take 70 mg by mouth once a week.     ezetimibe (ZETIA) 10 MG tablet Take 1 tablet by mouth daily.     OVER THE COUNTER MEDICATION Vit D3  once daily  Calcium one daily     simvastatin (ZOCOR) 20 MG tablet Take by mouth.     XARELTO 20 MG TABS tablet Take 20 mg by mouth daily.     No current facility-administered medications for this visit.    Allergies as of 07/27/2022 - Review Complete 07/27/2022  Allergen Reaction Noted   Acetaminophen  07/27/2022   Aspirin  07/27/2022     Family History  Problem Relation Age of Onset   Breast cancer Mother    Stroke Mother    Colon cancer Neg Hx    Colon polyps Neg Hx     Social History   Socioeconomic History   Marital status: Married    Spouse name: Not on file   Number of children: Not on file   Years of education: Not on file   Highest education level: Not on file  Occupational History   Not on file  Tobacco Use   Smoking status: Never    Passive exposure: Past   Smokeless tobacco: Never  Substance and Sexual Activity   Alcohol use: Never   Drug use: Never   Sexual activity: Not on file  Other Topics Concern   Not on file  Social History Narrative   Not on file   Social Determinants of Health   Financial Resource Strain: Not on file  Food Insecurity: Not on file  Transportation Needs: Not on file  Physical Activity: Not on file  Stress: Not on file  Social Connections: Not on file  Intimate Partner Violence: Not on file     Review of Systems   Gen: Denies any fever, chills, fatigue, weight loss, lack of appetite.  CV: Denies chest pain, heart palpitations, peripheral edema, syncope.  Resp:  Denies shortness of breath at rest or with exertion. Denies wheezing or cough.  GI: Denies dysphagia or odynophagia. Denies jaundice, hematemesis, fecal incontinence. GU : Denies urinary burning, urinary frequency, urinary hesitancy MS: Denies joint pain, muscle weakness, cramps, or limitation of movement.  Derm: Denies rash, itching, dry skin Psych: Denies depression, anxiety, memory loss, and confusion Heme: Denies bruising, bleeding, and enlarged lymph nodes.   Physical Exam   BP 111/64 (BP Location: Left Arm, Patient Position: Sitting, Cuff Size: Normal)   Pulse 97   Temp 98.4 F (36.9 C) (Oral)   Ht 5' 1.5" (1.562 m)   Wt 141 lb 12.8 oz (64.3 kg)   BMI 26.36 kg/m  General:   Alert and oriented. Pleasant and cooperative. Well-nourished and well-developed.  Head:  Normocephalic and  atraumatic. Eyes:  Without icterus Ears:  Normal auditory acuity. Lungs:  Clear to auscultation bilaterally.  Heart:  S1, S2 present without murmurs appreciated.  Abdomen:  +BS, soft, non-tender and non-distended. No HSM noted. No guarding or rebound. No masses appreciated.  Rectal:  Deferred  Msk:  Symmetrical without gross deformities. Normal posture. Extremities:  Without edema. Neurologic:  Alert and  oriented x4;  grossly normal neurologically. Skin:  Intact without significant lesions or rashes. Psych:  Alert and cooperative. Normal mood and affect.   Assessment   Julie Melton is a 76 y.o. female presenting today at the request of Hasanaj, Samul Dada, MD due to need for screening colonoscopy, as last was in 2005 without any polyps. No FH colon cancer, colon polyps.  She is presenting in the office today as she is on Xarelto, with history of PE in June 2023. Unclear any provoking factors.   Screening colonoscopy: no concerning lower GI signs/symptoms. Last in 2005 as mentioned. Will review holding Xarelto 48 hours prior to procedure. Would favor this in the new year so she will have been on Xarelto over 6 months without interruption.  Possible nodular contour of liver: noted on outside [place] Oct 2023. Normal spleen. No recent labs. No high risk factors for viral hepatitis but no Hep C screening on file for baseline. Will pursue elastography to further evaluate before outright labeling as cirrhosis. Doubt dealing with advanced liver disease and suspect this is more of hepatic steatosis. Labs ordered today and elastography. Will need EGD at time of colonoscopy if evidence of cirrhosis.     PLAN   Discuss holding Xarelto with PCP X 48 hours  Proceed with colonoscopy and POSSIBLE EGD (if evidence for cirrhosis )by Dr. Abbey Chatters  in near future: the risks, benefits, and alternatives have been discussed with the patient in detail. The patient states understanding and desires to proceed. ASA  3  CBC, CMP, INR, AFP, Viral serologies, iron studies  [place] elastography  Further recommendations to follow   Annitta Needs, PhD, ANP-BC [facility]

## 2022-07-28 LAB — CBC WITH DIFFERENTIAL/PLATELET
Basophils Absolute: 0.1 10*3/uL (ref 0.0–0.2)
Basos: 1 %
EOS (ABSOLUTE): 0.2 10*3/uL (ref 0.0–0.4)
Eos: 2 %
Hematocrit: 31.6 % — ABNORMAL LOW (ref 34.0–46.6)
Hemoglobin: 10.3 g/dL — ABNORMAL LOW (ref 11.1–15.9)
Immature Grans (Abs): 0 10*3/uL (ref 0.0–0.1)
Immature Granulocytes: 1 %
Lymphocytes Absolute: 1.1 10*3/uL (ref 0.7–3.1)
Lymphs: 15 %
MCH: 28.1 pg (ref 26.6–33.0)
MCHC: 32.6 g/dL (ref 31.5–35.7)
MCV: 86 fL (ref 79–97)
Monocytes Absolute: 0.9 10*3/uL (ref 0.1–0.9)
Monocytes: 11 %
Neutrophils Absolute: 5.5 10*3/uL (ref 1.4–7.0)
Neutrophils: 70 %
Platelets: 201 10*3/uL (ref 150–450)
RBC: 3.67 x10E6/uL — ABNORMAL LOW (ref 3.77–5.28)
RDW: 12.4 % (ref 11.7–15.4)
WBC: 7.7 10*3/uL (ref 3.4–10.8)

## 2022-07-28 LAB — COMPREHENSIVE METABOLIC PANEL WITH GFR
ALT: 19 [IU]/L (ref 0–32)
AST: 37 [IU]/L (ref 0–40)
Albumin/Globulin Ratio: 1.9 (ref 1.2–2.2)
Albumin: 3.9 g/dL (ref 3.8–4.8)
Alkaline Phosphatase: 118 [IU]/L (ref 44–121)
BUN/Creatinine Ratio: 12 (ref 12–28)
BUN: 12 mg/dL (ref 8–27)
Bilirubin Total: 0.3 mg/dL (ref 0.0–1.2)
CO2: 22 mmol/L (ref 20–29)
Calcium: 9.6 mg/dL (ref 8.7–10.3)
Chloride: 104 mmol/L (ref 96–106)
Creatinine, Ser: 1.01 mg/dL — ABNORMAL HIGH (ref 0.57–1.00)
Globulin, Total: 2.1 g/dL (ref 1.5–4.5)
Glucose: 101 mg/dL — ABNORMAL HIGH (ref 70–99)
Potassium: 4.5 mmol/L (ref 3.5–5.2)
Sodium: 142 mmol/L (ref 134–144)
Total Protein: 6 g/dL (ref 6.0–8.5)
eGFR: 58 mL/min/{1.73_m2} — ABNORMAL LOW

## 2022-07-28 LAB — IRON,TIBC AND FERRITIN PANEL
Ferritin: 28 ng/mL (ref 15–150)
Iron Saturation: 8 % — CL (ref 15–55)
Iron: 28 ug/dL (ref 27–139)
Total Iron Binding Capacity: 372 ug/dL (ref 250–450)
UIBC: 344 ug/dL (ref 118–369)

## 2022-07-28 LAB — HEPATITIS C ANTIBODY: Hep C Virus Ab: NONREACTIVE

## 2022-07-28 LAB — HEPATITIS B SURFACE ANTIGEN: Hepatitis B Surface Ag: NEGATIVE

## 2022-07-28 LAB — HEPATITIS B SURFACE ANTIBODY,QUALITATIVE: Hep B Surface Ab, Qual: NONREACTIVE

## 2022-07-28 LAB — AFP TUMOR MARKER: AFP, Serum, Tumor Marker: <1.8 ng/mL (ref 0.0–9.2)

## 2022-07-28 LAB — PROTIME-INR
INR: 1.5 — ABNORMAL HIGH (ref 0.9–1.2)
Prothrombin Time: 16.3 s — ABNORMAL HIGH (ref 9.1–12.0)

## 2022-07-28 LAB — HEPATITIS A ANTIBODY, TOTAL: hep A Total Ab: POSITIVE — AB

## 2022-08-04 ENCOUNTER — Telehealth (INDEPENDENT_AMBULATORY_CARE_PROVIDER_SITE_OTHER): Payer: Self-pay | Admitting: *Deleted

## 2022-08-04 NOTE — Telephone Encounter (Signed)
  Request for patient to stop medication prior to procedure or is needing cleareance  08/04/22  Julie Melton 06/26/1946  What type of surgery is being performed? Colonoscopy with possible upper endoscopy  When is surgery scheduled? TBD in January  What type of clearance is required (medical or pharmacy to hold medication or both? medication  Are there any medications that need to be held prior to surgery and how long? Xarelto 48 hours prior  Name of physician performing surgery?  Dr. Arvilla Meres [facility]astroenterology at [facility] Phone: [phone] Fax: [phone]  Anethesia type (none, local, MAC, general)? MAC

## 2022-08-04 NOTE — Telephone Encounter (Signed)
FAXED TO pcp

## 2022-08-08 ENCOUNTER — Ambulatory Visit (HOSPITAL_COMMUNITY)
Admission: RE | Admit: 2022-08-08 | Discharge: 2022-08-08 | Disposition: A | Discharge status: Home / Self Care | Payer: Medicare Other | Source: Ambulatory Visit | Attending: Gastroenterology | Admitting: Gastroenterology

## 2022-08-08 DIAGNOSIS — R9389 Abnormal findings on diagnostic imaging of other specified body structures: Secondary | ICD-10-CM | POA: Diagnosis not present

## 2022-08-08 DIAGNOSIS — K760 Fatty (change of) liver, not elsewhere classified: Secondary | ICD-10-CM | POA: Diagnosis not present

## 2022-09-07 NOTE — Telephone Encounter (Signed)
Clearance to hold Xarelto for 48 hours prior to procedure approved and placed on providers desk

## 2022-09-13 DIAGNOSIS — E7849 Other hyperlipidemia: Secondary | ICD-10-CM | POA: Diagnosis not present

## 2022-09-13 DIAGNOSIS — Z131 Encounter for screening for diabetes mellitus: Secondary | ICD-10-CM | POA: Diagnosis not present

## 2022-09-13 DIAGNOSIS — Z6824 Body mass index (BMI) 24.0-24.9, adult: Secondary | ICD-10-CM | POA: Diagnosis not present

## 2022-09-13 DIAGNOSIS — I10 Essential (primary) hypertension: Secondary | ICD-10-CM | POA: Diagnosis not present

## 2022-09-13 DIAGNOSIS — Z0000 Encounter for general adult medical examination without abnormal findings: Secondary | ICD-10-CM | POA: Diagnosis not present

## 2022-09-13 DIAGNOSIS — I2699 Other pulmonary embolism without acute cor pulmonale: Secondary | ICD-10-CM | POA: Diagnosis not present

## 2022-09-13 DIAGNOSIS — M818 Other osteoporosis without current pathological fracture: Secondary | ICD-10-CM | POA: Diagnosis not present

## 2022-09-13 NOTE — Telephone Encounter (Signed)
Noted. Please arrange procedure for colonoscopy/EGD.

## 2022-09-15 ENCOUNTER — Encounter: Payer: Self-pay | Admitting: *Deleted

## 2022-09-15 MED ORDER — PEG 3350-KCL-NA BICARB-NACL 420 G PO SOLR: 4000.0000 mL | Freq: Once | ORAL | 0 refills | Status: AC

## 2022-09-15 NOTE — Telephone Encounter (Signed)
[employer] PA: APPROVED  Authorization #: [id number] DOS:09/26/22-12/25/22

## 2022-09-15 NOTE — Telephone Encounter (Signed)
Pt has been scheduled for 09/26/22 at 11:15 am. Instructions mailed and prep sent to the pharmacy

## 2022-09-20 NOTE — Patient Instructions (Addendum)
Your procedure is scheduled on: 09/26/2022  Report to [facility] at   9:15  AM.  Call this number if you have problems the morning of surgery: [phone]   Remember:              Follow Directions on the letter you received from Your Physician's office regarding the Bowel Prep              No Smoking the day of Procedure :   Take these medicines the morning of surgery with A SIP OF WATER: none  Hold Xarelto 48 hours as instructed in letter,  Last dose 09/23/2022   Do not wear jewelry, make-up or nail polish.    Do not bring valuables to the hospital.  Contacts, dentures or bridgework may not be worn into surgery.  .   Patients discharged the day of surgery will not be allowed to drive home.     Colonoscopy, Adult, Care After This sheet gives you information about how to care for yourself after your procedure. Your health care provider may also give you more specific instructions. If you have problems or questions, contact your health care provider. What can I expect after the procedure? After the procedure, it is common to have: A small amount of blood in your stool for 24 hours after the procedure. Some gas. Mild abdominal cramping or bloating.  Follow these instructions at home: General instructions  For the first 24 hours after the procedure: Do not drive or use machinery. Do not sign important documents. Do not drink alcohol. Do your regular daily activities at a slower pace than normal. Eat soft, easy-to-digest foods. Rest often. Take over-the-counter or prescription medicines only as told by your health care provider. It is up to you to get the results of your procedure. Ask your health care provider, or the department performing the procedure, when your results will be ready. Relieving cramping and bloating Try walking around when you have cramps or feel bloated. Apply heat to your abdomen as told by your health care provider. Use a heat source  that your health care provider recommends, such as a moist heat pack or a heating pad. Place a towel between your skin and the heat source. Leave the heat on for 20-30 minutes. Remove the heat if your skin turns bright red. This is especially important if you are unable to feel pain, heat, or cold. You may have a greater risk of getting burned. Eating and drinking Drink enough fluid to keep your urine clear or pale yellow. Resume your normal diet as instructed by your health care provider. Avoid heavy or fried foods that are hard to digest. Avoid drinking alcohol for as long as instructed by your health care provider. Contact a health care provider if: You have blood in your stool 2-3 days after the procedure. Get help right away if: You have more than a small spotting of blood in your stool. You pass large blood clots in your stool. Your abdomen is swollen. You have nausea or vomiting. You have a fever. You have increasing abdominal pain that is not relieved with medicine. This information is not intended to replace advice given to you by your health care provider. Make sure you discuss any questions you have with your health care provider. Document Released: 04/05/2004 Document Revised: 05/16/2016 Document Reviewed: 11/03/2015 Elsevier Interactive Patient Education  2018 [place] [facility], Adult, Care After After the procedure, it is common to have  a sore throat. It is also common to have: Mild stomach pain or discomfort. Bloating. Nausea. Follow these instructions at home: The instructions below may help you care for yourself at home. Your health care provider may give you more instructions. If you have questions, ask your health care provider. If you were given a sedative during the procedure, it can affect you for several hours. Do not drive or operate machinery until your health care provider says that it is safe. If you will be going home right after the procedure,  plan to have a responsible adult: Take you home from the hospital or clinic. You will not be allowed to drive. Care for you for the time you are told. Follow instructions from your health care provider about what you may eat and drink. Return to your normal activities as told by your health care provider. Ask your health care provider what activities are safe for you. Take over-the-counter and prescription medicines only as told by your health care provider. Contact a health care provider if you: Have a sore throat that lasts longer than one day. Have trouble swallowing. Have a fever. Get help right away if you: Vomit blood or your vomit looks like coffee grounds. Have bloody, black, or tarry stools. Have a very bad sore throat or you cannot swallow. Have difficulty breathing or very bad pain in your chest or abdomen. These symptoms may be an emergency. Get help right away. Call 911. Do not wait to see if the symptoms will go away. Do not drive yourself to the hospital. Summary After the procedure, it is common to have a sore throat, mild stomach discomfort, bloating, and nausea. If you were given a sedative during the procedure, it can affect you for several hours. Do not drive until your health care provider says that it is safe. Follow instructions from your health care provider about what you may eat and drink. Return to your normal activities as told by your health care provider. This information is not intended to replace advice given to you by your health care provider. Make sure you discuss any questions you have with your health care provider. Document Revised: 12/01/2021 Document Reviewed: 12/01/2021 Elsevier Patient Education  [place].

## 2022-09-22 ENCOUNTER — Encounter (HOSPITAL_COMMUNITY): Payer: Self-pay

## 2022-09-22 ENCOUNTER — Encounter (HOSPITAL_COMMUNITY)
Admission: RE | Admit: 2022-09-22 | Discharge: 2022-09-22 | Disposition: A | Discharge status: Home / Self Care | Payer: Medicare Other | Source: Ambulatory Visit | Attending: Internal Medicine | Admitting: Internal Medicine

## 2022-09-22 VITALS — BP 127/50 | HR 90 | Temp 97.8°F | Resp 18 | Ht 61.5 in | Wt 141.8 lb

## 2022-09-22 DIAGNOSIS — Z01812 Encounter for preprocedural laboratory examination: Secondary | ICD-10-CM | POA: Diagnosis not present

## 2022-09-22 DIAGNOSIS — D649 Anemia, unspecified: Secondary | ICD-10-CM | POA: Insufficient documentation

## 2022-09-22 LAB — CBC WITH DIFFERENTIAL/PLATELET
Abs Immature Granulocytes: 0.01 10*3/uL (ref 0.00–0.07)
Basophils Absolute: 0 10*3/uL (ref 0.0–0.1)
Basophils Relative: 0 %
Eosinophils Absolute: 0.1 10*3/uL (ref 0.0–0.5)
Eosinophils Relative: 2 %
HCT: 28.3 % — ABNORMAL LOW (ref 36.0–46.0)
Hemoglobin: 8.7 g/dL — ABNORMAL LOW (ref 12.0–15.0)
Immature Granulocytes: 0 %
Lymphocytes Relative: 28 %
Lymphs Abs: 1.4 10*3/uL (ref 0.7–4.0)
MCH: 27.5 pg (ref 26.0–34.0)
MCHC: 30.7 g/dL (ref 30.0–36.0)
MCV: 89.6 fL (ref 80.0–100.0)
Monocytes Absolute: 0.6 10*3/uL (ref 0.1–1.0)
Monocytes Relative: 12 %
Neutro Abs: 2.9 10*3/uL (ref 1.7–7.7)
Neutrophils Relative %: 58 %
Platelets: 182 10*3/uL (ref 150–400)
RBC: 3.16 MIL/uL — ABNORMAL LOW (ref 3.87–5.11)
RDW: 14 % (ref 11.5–15.5)
WBC: 5 10*3/uL (ref 4.0–10.5)
nRBC: 0 % (ref 0.0–0.2)

## 2022-09-26 ENCOUNTER — Ambulatory Visit (HOSPITAL_COMMUNITY)
Admission: RE | Admit: 2022-09-26 | Discharge: 2022-09-26 | Disposition: A | Discharge status: Home / Self Care | Payer: Medicare Other | Attending: Internal Medicine | Admitting: Internal Medicine

## 2022-09-26 ENCOUNTER — Ambulatory Visit (HOSPITAL_BASED_OUTPATIENT_CLINIC_OR_DEPARTMENT_OTHER): Payer: Medicare Other | Admitting: Anesthesiology

## 2022-09-26 ENCOUNTER — Ambulatory Visit (HOSPITAL_COMMUNITY): Payer: Medicare Other | Admitting: Anesthesiology

## 2022-09-26 ENCOUNTER — Other Ambulatory Visit: Payer: Self-pay

## 2022-09-26 ENCOUNTER — Encounter (HOSPITAL_COMMUNITY): Payer: Self-pay

## 2022-09-26 ENCOUNTER — Encounter (HOSPITAL_COMMUNITY)
Admission: RE | Disposition: A | Discharge status: Home / Self Care | Payer: Self-pay | Source: Home / Self Care | Attending: Internal Medicine

## 2022-09-26 DIAGNOSIS — D123 Benign neoplasm of transverse colon: Secondary | ICD-10-CM | POA: Diagnosis not present

## 2022-09-26 DIAGNOSIS — K7460 Unspecified cirrhosis of liver: Secondary | ICD-10-CM

## 2022-09-26 DIAGNOSIS — K449 Diaphragmatic hernia without obstruction or gangrene: Secondary | ICD-10-CM | POA: Diagnosis not present

## 2022-09-26 DIAGNOSIS — Z1211 Encounter for screening for malignant neoplasm of colon: Secondary | ICD-10-CM | POA: Diagnosis not present

## 2022-09-26 DIAGNOSIS — K648 Other hemorrhoids: Secondary | ICD-10-CM | POA: Diagnosis not present

## 2022-09-26 DIAGNOSIS — K222 Esophageal obstruction: Secondary | ICD-10-CM

## 2022-09-26 DIAGNOSIS — K529 Noninfective gastroenteritis and colitis, unspecified: Secondary | ICD-10-CM | POA: Diagnosis not present

## 2022-09-26 DIAGNOSIS — K635 Polyp of colon: Secondary | ICD-10-CM | POA: Diagnosis not present

## 2022-09-26 DIAGNOSIS — D122 Benign neoplasm of ascending colon: Secondary | ICD-10-CM | POA: Insufficient documentation

## 2022-09-26 DIAGNOSIS — D175 Benign lipomatous neoplasm of intra-abdominal organs: Secondary | ICD-10-CM

## 2022-09-26 DIAGNOSIS — R9389 Abnormal findings on diagnostic imaging of other specified body structures: Secondary | ICD-10-CM

## 2022-09-26 DIAGNOSIS — Z139 Encounter for screening, unspecified: Secondary | ICD-10-CM | POA: Diagnosis not present

## 2022-09-26 DIAGNOSIS — Z13810 Encounter for screening for upper gastrointestinal disorder: Secondary | ICD-10-CM | POA: Diagnosis not present

## 2022-09-26 HISTORY — PX: POLYPECTOMY: SHX5525

## 2022-09-26 HISTORY — PX: ESOPHAGOGASTRODUODENOSCOPY (EGD) WITH PROPOFOL: SHX5813

## 2022-09-26 HISTORY — PX: COLONOSCOPY WITH PROPOFOL: SHX5780

## 2022-09-26 HISTORY — PX: BIOPSY: SHX5522

## 2022-09-26 SURGERY — COLONOSCOPY WITH PROPOFOL
Anesthesia: General

## 2022-09-26 MED ORDER — LIDOCAINE HCL (CARDIAC) PF 100 MG/5ML IV SOSY
PREFILLED_SYRINGE | INTRAVENOUS | Status: DC | PRN
  Administered 2022-09-26: 50 mg via INTRAVENOUS

## 2022-09-26 MED ORDER — PROPOFOL 10 MG/ML IV BOLUS
INTRAVENOUS | Status: DC | PRN
  Administered 2022-09-26: 30 mg via INTRAVENOUS
  Administered 2022-09-26: 80 mg via INTRAVENOUS
  Administered 2022-09-26: 20 mg via INTRAVENOUS

## 2022-09-26 MED ORDER — LACTATED RINGERS IV SOLN: INTRAVENOUS | Status: DC

## 2022-09-26 MED ORDER — PROPOFOL 500 MG/50ML IV EMUL
INTRAVENOUS | Status: DC | PRN
  Administered 2022-09-26: 150 ug/kg/min via INTRAVENOUS

## 2022-09-26 NOTE — Anesthesia Preprocedure Evaluation (Signed)
Anesthesia Evaluation  Patient identified by MRN, date of birth, ID band Patient awake    Reviewed: Allergy & Precautions, H&P , NPO status , Patient's Chart, lab work & pertinent test results  History of Anesthesia Complications Negative for: history of anesthetic complications  Airway Mallampati: II  TM Distance: >3 FB Neck ROM: Full    Dental  (+) Dental Advisory Given, Caps Crowns :   Pulmonary PE   Pulmonary exam normal breath sounds clear to auscultation       Cardiovascular Normal cardiovascular exam Rhythm:Regular Rate:Normal     Neuro/Psych negative neurological ROS  negative psych ROS   GI/Hepatic negative GI ROS, Neg liver ROS,,,  Endo/Other  negative endocrine ROS    Renal/GU negative Renal ROS  negative genitourinary   Musculoskeletal negative musculoskeletal ROS (+)    Abdominal   Peds negative pediatric ROS (+)  Hematology negative hematology ROS (+)   Anesthesia Other Findings   Reproductive/Obstetrics negative OB ROS                             Anesthesia Physical Anesthesia Plan  ASA: 3  Anesthesia Plan: General   Post-op Pain Management: Minimal or no pain anticipated   Induction: Intravenous  PONV Risk Score and Plan: 1 and Propofol infusion  Airway Management Planned: Nasal Cannula and Natural Airway  Additional Equipment:   Intra-op Plan:   Post-operative Plan:   Informed Consent: I have reviewed the patients History and Physical, chart, labs and discussed the procedure including the risks, benefits and alternatives for the proposed anesthesia with the patient or authorized representative who has indicated his/her understanding and acceptance.     Dental advisory given  Plan Discussed with: CRNA and Surgeon  Anesthesia Plan Comments:         Anesthesia Quick Evaluation

## 2022-09-26 NOTE — Op Note (Signed)
[facility] Patient Name: Julie Melton Procedure Date: 09/26/2022 10:27 AM MRN: [id number] Date of Birth: 1946/01/31 Attending MD: Elon Alas. Abbey Chatters , [place], [id number] CSN: [id number] Age: 77 Admit Type: Outpatient Procedure:                Colonoscopy Indications:              Screening for colorectal malignant neoplasm Providers:                Elon Alas. Abbey Chatters, DO, Crystal Page, Rosina Lowenstein,                            RN, Raphael Gibney, Technician Referring MD:              Medicines:                See the Anesthesia note for documentation of the                            administered medications Complications:            No immediate complications. Estimated Blood Loss:     Estimated blood loss was minimal. Procedure:                Pre-Anesthesia Assessment:                           - The anesthesia plan was to use monitored                            anesthesia care (MAC).                           After obtaining informed consent, the colonoscope                            was passed under direct vision. Throughout the                            procedure, the patient's blood pressure, pulse, and                            oxygen saturations were monitored continuously. The                            [id number]-[id number] ([id number]) was introduced through the                            anus and advanced to the the cecum, identified by                            appendiceal orifice and ileocecal valve. The                            colonoscopy was performed without difficulty. The                            patient tolerated the procedure  well. The quality                            of the bowel preparation was evaluated using the                            BBPS [facility] Bowel Preparation Scale) with scores                            of: Right Colon = 3, Transverse Colon = 3 and Left                            Colon = 3 (entire mucosa seen well with no residual                             staining, small fragments of stool or opaque                            liquid). The total BBPS score equals 9. Scope In: 10:42:45 AM Scope Out: 11:03:22 AM Scope Withdrawal Time: 0 hours 14 minutes 53 seconds  Total Procedure Duration: 0 hours 20 minutes 37 seconds  Findings:      The perianal and digital rectal examinations were normal.      Two sessile polyps were found in the ascending colon. The polyps were 3       to 4 mm in size. These polyps were removed with a cold snare. Resection       was complete on both polyps. One polyp NOT retrieved      Two sessile polyps were found in the transverse colon. The polyps were 4       to 6 mm in size. These polyps were removed with a cold snare. Resection       and retrieval were complete.      A localized area of nodular mucosa was found in the ascending colon and       in the cecum. Unclear significance Biopsies were taken with a cold       forceps for histology.      Non-bleeding internal hemorrhoids were found during endoscopy. Impression:               - Two 3 to 4 mm polyps in the ascending colon,                            removed with a cold snare. Resected and retrieved.                           - Two 4 to 6 mm polyps in the transverse colon,                            removed with a cold snare. Resected and retrieved.                           - Nodular mucosa in the ascending colon and in the  cecum. Biopsied.                           - Non-bleeding internal hemorrhoids. Moderate Sedation:      Per Anesthesia Care Recommendation:           - Patient has a contact number available for                            emergencies. The signs and symptoms of potential                            delayed complications were discussed with the                            patient. Return to normal activities tomorrow.                            Written discharge instructions were provided to the                             patient.                           - Resume previous diet.                           - Continue present medications.                           - Await pathology results.                           - No repeat colonoscopy due to age.                           - Return to GI clinic in 6 months. Procedure Code(s):        --- Professional ---                           [phone], Colonoscopy, flexible; with removal of                            tumor(s), polyp(s), or other lesion(s) by snare                            technique Diagnosis Code(s):        --- Professional ---                           Z12.11, Encounter for screening for malignant                            neoplasm of colon                           D12.2, Benign neoplasm of ascending colon  D12.3, Benign neoplasm of transverse colon (hepatic                            flexure or splenic flexure) CPT copyright 2022 American Medical Association. All rights reserved. The codes documented in this report are preliminary and upon coder review may  be revised to meet current compliance requirements. Elon Alas. Abbey Chatters, DO [place] Abbey Chatters, DO 09/26/2022 11:09:07 AM This report has been signed electronically. Number of Addenda: 0

## 2022-09-26 NOTE — Transfer of Care (Signed)
Immediate Anesthesia Transfer of Care Note  Patient: Julie Melton  Procedure(s) Performed: COLONOSCOPY WITH PROPOFOL ESOPHAGOGASTRODUODENOSCOPY (EGD) WITH PROPOFOL POLYPECTOMY BIOPSY  Patient Location: Short Stay  Anesthesia Type:General  Level of Consciousness: drowsy  Airway & Oxygen Therapy: Patient Spontanous Breathing  Post-op Assessment: Report given to RN and Post -op Vital signs reviewed and stable  Post vital signs: Reviewed and stable  Last Vitals:  Vitals Value Taken Time  BP 94/53 09/26/22 1106  Temp 36.6 C 09/26/22 1106  Pulse 83 09/26/22 1106  Resp 20 09/26/22 1106  SpO2 99 % 09/26/22 1106    Last Pain:  Vitals:   09/26/22 1106  TempSrc: Axillary  PainSc:       Patients Stated Pain Goal: 9 (54/36/06 7703)  Complications: No notable events documented.

## 2022-09-26 NOTE — H&P (Signed)
Primary Care Physician:  Neale Burly, MD Primary Gastroenterologist:  Dr. Abbey Chatters  Pre-Procedure History & Physical: HPI:  Julie Melton is a 77 y.o. female is here for an EGD for cirrhosis/variceal screening and a colonoscopy to be performed for colon cancer screening purposes.  Past Medical History:  Diagnosis Date   Hyperlipemia    Osteopenia    Pulmonary embolism ([place]) 02/2022    Past Surgical History:  Procedure Laterality Date   BREAST EXCISIONAL BIOPSY Right 2006    Prior to Admission medications   Medication Sig Start Date End Date Taking? Authorizing Provider  alendronate (FOSAMAX) 70 MG tablet Take 70 mg by mouth every Saturday. 07/15/22  Yes [provider]  Cholecalciferol (QC VITAMIN D3) 50 MCG (2000 UT) TABS Take 2,000 Units by mouth in the morning.   Yes [provider]  cyanocobalamin (VITAMIN B12) 1000 MCG tablet Take 1,000 mcg by mouth in the morning.   Yes [provider]  ezetimibe (ZETIA) 10 MG tablet Take 10 mg by mouth every evening. 07/07/22  Yes [provider]  simvastatin (ZOCOR) 20 MG tablet Take 20 mg by mouth every evening. 07/07/22  Yes [provider]  XARELTO 20 MG TABS tablet Take 20 mg by mouth in the morning.   Yes [provider]    Allergies as of 09/15/2022 - Review Complete 07/27/2022  Allergen Reaction Noted   Acetaminophen  07/27/2022   Aspirin  07/27/2022    Family History  Problem Relation Age of Onset   Breast cancer Mother    Stroke Mother    Colon cancer Neg Hx    Colon polyps Neg Hx     Social History   Socioeconomic History   Marital status: Married    Spouse name: Not on file   Number of children: Not on file   Years of education: Not on file   Highest education level: Not on file  Occupational History   Not on file  Tobacco Use   Smoking status: Never    Passive exposure: Past   Smokeless tobacco: Never  Vaping Use   Vaping Use: Never used   Substance and Sexual Activity   Alcohol use: Never   Drug use: Never   Sexual activity: Not on file  Other Topics Concern   Not on file  Social History Narrative   Not on file   Social Determinants of Health   Financial Resource Strain: Not on file  Food Insecurity: Not on file  Transportation Needs: Not on file  Physical Activity: Not on file  Stress: Not on file  Social Connections: Not on file  Intimate Partner Violence: Not on file    Review of Systems: See HPI, otherwise negative ROS  Physical Exam: Vital signs in last 24 hours: Temp:  [97.8 F (36.6 C)] 97.8 F (36.6 C) (01/22 1012) Pulse Rate:  [101] 101 (01/22 1012) Resp:  [11] 11 (01/22 1012) BP: (129)/(50) 129/50 (01/22 1012) SpO2:  [100 %] 100 % (01/22 1012)   General:   Alert,  Well-developed, well-nourished, pleasant and cooperative in NAD Head:  Normocephalic and atraumatic. Eyes:  Sclera clear, no icterus.   Conjunctiva pink. Ears:  Normal auditory acuity. Nose:  No deformity, discharge,  or lesions. Msk:  Symmetrical without gross deformities. Normal posture. Extremities:  Without clubbing or edema. Neurologic:  Alert and  oriented x4;  grossly normal neurologically. Skin:  Intact without significant lesions or rashes. Psych:  Alert and cooperative. Normal mood and  affect.  Impression/Plan: Julie Melton is here for an EGD for cirrhosis/variceal screening and a colonoscopy to be performed for colon cancer screening purposes.  The risks of the procedure including infection, bleed, or perforation as well as benefits, limitations, alternatives and imponderables have been reviewed with the patient. Questions have been answered. All parties agreeable.

## 2022-09-26 NOTE — Anesthesia Postprocedure Evaluation (Signed)
Anesthesia Post Note  Patient: Julie Melton  Procedure(s) Performed: COLONOSCOPY WITH PROPOFOL ESOPHAGOGASTRODUODENOSCOPY (EGD) WITH PROPOFOL POLYPECTOMY BIOPSY  Patient location during evaluation: Phase II Anesthesia Type: General Level of consciousness: awake and alert and oriented Pain management: pain level controlled Vital Signs Assessment: post-procedure vital signs reviewed and stable Respiratory status: spontaneous breathing, nonlabored ventilation and respiratory function stable Cardiovascular status: blood pressure returned to baseline and stable Postop Assessment: no apparent nausea or vomiting Anesthetic complications: no  No notable events documented.   Last Vitals:  Vitals:   09/26/22 1109 09/26/22 1116  BP: (!) 94/57 (!) 99/49  Pulse: 82   Resp:    Temp:    SpO2: 100%     Last Pain:  Vitals:   09/26/22 1113  TempSrc:   PainSc: 0-No pain                 Lovelee Forner C Ramona Ruark

## 2022-09-26 NOTE — Op Note (Signed)
[facility] Patient Name: Julie Melton Procedure Date: 09/26/2022 10:28 AM MRN: [id number] Date of Birth: Jul 05, 1946 Attending MD: Elon Alas. Abbey Chatters , [place], [id number] CSN: [id number] Age: 77 Admit Type: Outpatient Procedure:                Upper GI endoscopy Indications:              Screening procedure, Cirrhosis rule out esophageal                            varices Providers:                Elon Alas. Abbey Chatters, DO, Tammy Vaught, RN, Crystal                            Page, Raphael Gibney, Technician Referring MD:              Medicines:                See the Anesthesia note for documentation of the                            administered medications Complications:            No immediate complications. Estimated Blood Loss:     Estimated blood loss: none. Procedure:                Pre-Anesthesia Assessment:                           - The anesthesia plan was to use monitored                            anesthesia care (MAC).                           After obtaining informed consent, the endoscope was                            passed under direct vision. Throughout the                            procedure, the patient's blood pressure, pulse, and                            oxygen saturations were monitored continuously. The                            [id number]-[id number] ([id number]) scope was introduced through the                            mouth, and advanced to the second part of duodenum.                            The upper GI endoscopy was accomplished without                            difficulty. The patient tolerated the  procedure                            well. Scope In: 10:36:50 AM Scope Out: 10:38:25 AM Total Procedure Duration: 0 hours 1 minute 35 seconds  Findings:      There is no endoscopic evidence of varices in the entire esophagus.      A small hiatal hernia was present.      A non-obstructing Schatzki ring was found in the distal esophagus.      The entire examined  stomach was normal.      There was a medium-sized lipoma in the first portion of the duodenum. Impression:               - Small hiatal hernia.                           - Non-obstructing Schatzki ring.                           - Normal stomach.                           - Duodenal lipoma.                           - No specimens collected. Moderate Sedation:      Per Anesthesia Care Recommendation:           - Patient has a contact number available for                            emergencies. The signs and symptoms of potential                            delayed complications were discussed with the                            patient. Return to normal activities tomorrow.                            Written discharge instructions were provided to the                            patient.                           - Resume previous diet.                           - Continue present medications.                           - Repeat upper endoscopy in 3 years for screening                            purposes or sooner if decompensating event.                           -  Return to GI clinic in 6 months. Procedure Code(s):        --- Professional ---                           [phone], Esophagogastroduodenoscopy, flexible,                            transoral; diagnostic, including collection of                            specimen(s) by brushing or washing, when performed                            (separate procedure) Diagnosis Code(s):        --- Professional ---                           K44.9, Diaphragmatic hernia without obstruction or                            gangrene                           K22.2, Esophageal obstruction                           D17.5, Benign lipomatous neoplasm of                            intra-abdominal organs                           Z13.810, Encounter for screening for upper                            gastrointestinal disorder                           K74.60,  Unspecified cirrhosis of liver CPT copyright 2022 [employer]. All rights reserved. The codes documented in this report are preliminary and upon coder review may  be revised to meet current compliance requirements. Elon Alas. Abbey Chatters, DO [place] Abbey Chatters, DO 09/26/2022 10:40:41 AM This report has been signed electronically. Number of Addenda: 0

## 2022-09-26 NOTE — Discharge Instructions (Addendum)
EGD Discharge instructions Please read the instructions outlined below and refer to this sheet in the next few weeks. These discharge instructions provide you with general information on caring for yourself after you leave the hospital. Your doctor may also give you specific instructions. While your treatment has been planned according to the most current medical practices available, unavoidable complications occasionally occur. If you have any problems or questions after discharge, please call your doctor. ACTIVITY You may resume your regular activity but move at a slower pace for the next 24 hours.  Take frequent rest periods for the next 24 hours.  Walking will help expel (get rid of) the air and reduce the bloated feeling in your abdomen.  No driving for 24 hours (because of the anesthesia (medicine) used during the test).  You may shower.  Do not sign any important legal documents or operate any machinery for 24 hours (because of the anesthesia used during the test).  NUTRITION Drink plenty of fluids.  You may resume your normal diet.  Begin with a light meal and progress to your normal diet.  Avoid alcoholic beverages for 24 hours or as instructed by your caregiver.  MEDICATIONS You may resume your normal medications unless your caregiver tells you otherwise.  WHAT YOU CAN EXPECT TODAY You may experience abdominal discomfort such as a feeling of fullness or "gas" pains.  FOLLOW-UP Your doctor will discuss the results of your test with you.  SEEK IMMEDIATE MEDICAL ATTENTION IF ANY OF THE FOLLOWING OCCUR: Excessive nausea (feeling sick to your stomach) and/or vomiting.  Severe abdominal pain and distention (swelling).  Trouble swallowing.  Temperature over 101 F (37.8 C).  Rectal bleeding or vomiting of blood.     Colonoscopy Discharge Instructions  Read the instructions outlined below and refer to this sheet in the next few weeks. These discharge instructions provide you with  general information on caring for yourself after you leave the hospital. Your doctor may also give you specific instructions. While your treatment has been planned according to the most current medical practices available, unavoidable complications occasionally occur.   ACTIVITY You may resume your regular activity, but move at a slower pace for the next 24 hours.  Take frequent rest periods for the next 24 hours.  Walking will help get rid of the air and reduce the bloated feeling in your belly (abdomen).  No driving for 24 hours (because of the medicine (anesthesia) used during the test).   Do not sign any important legal documents or operate any machinery for 24 hours (because of the anesthesia used during the test).  NUTRITION Drink plenty of fluids.  You may resume your normal diet as instructed by your doctor.  Begin with a light meal and progress to your normal diet. Heavy or fried foods are harder to digest and may make you feel sick to your stomach (nauseated).  Avoid alcoholic beverages for 24 hours or as instructed.  MEDICATIONS You may resume your normal medications unless your doctor tells you otherwise.  WHAT YOU CAN EXPECT TODAY Some feelings of bloating in the abdomen.  Passage of more gas than usual.  Spotting of blood in your stool or on the toilet paper.  IF YOU HAD POLYPS REMOVED DURING THE COLONOSCOPY: No aspirin products for 7 days or as instructed.  No alcohol for 7 days or as instructed.  Eat a soft diet for the next 24 hours.  FINDING OUT THE RESULTS OF YOUR TEST Not all test results are  available during your visit. If your test results are not back during the visit, make an appointment with your caregiver to find out the results. Do not assume everything is normal if you have not heard from your caregiver or the medical facility. It is important for you to follow up on all of your test results.  SEEK IMMEDIATE MEDICAL ATTENTION IF: You have more than a spotting of  blood in your stool.  Your belly is swollen (abdominal distention).  You are nauseated or vomiting.  You have a temperature over 101.  You have abdominal pain or discomfort that is severe or gets worse throughout the day.   Overall your upper GI tract looked great.  No evidence of varices.  Stomach and small bowel appeared normal.  You do have a small hiatal hernia.  Your colonoscopy revealed 4 polyp(s) which I removed successfully. Await pathology results, my office will contact you.  Given your age, I think it is reasonable to not pursue further colonoscopies for colon cancer screening.  There was a area on the right side your colon that looked slightly nodular.  This is likely nothing to worry about.  I did take samples of it.  Follow-up with GI in 6 months.   I hope you have a great rest of your week!  Elon Alas. Abbey Chatters, D.O. Gastroenterology and Hepatology [facility]  You may start your Xarelto back tomorrow.

## 2022-09-27 LAB — SURGICAL PATHOLOGY

## 2022-09-30 ENCOUNTER — Encounter (HOSPITAL_COMMUNITY): Payer: Self-pay | Admitting: Internal Medicine

## 2022-12-13 DIAGNOSIS — Z6824 Body mass index (BMI) 24.0-24.9, adult: Secondary | ICD-10-CM | POA: Diagnosis not present

## 2022-12-13 DIAGNOSIS — I2699 Other pulmonary embolism without acute cor pulmonale: Secondary | ICD-10-CM | POA: Diagnosis not present

## 2022-12-13 DIAGNOSIS — Z0000 Encounter for general adult medical examination without abnormal findings: Secondary | ICD-10-CM | POA: Diagnosis not present

## 2022-12-13 DIAGNOSIS — M818 Other osteoporosis without current pathological fracture: Secondary | ICD-10-CM | POA: Diagnosis not present

## 2022-12-13 DIAGNOSIS — E7849 Other hyperlipidemia: Secondary | ICD-10-CM | POA: Diagnosis not present

## 2022-12-13 DIAGNOSIS — I10 Essential (primary) hypertension: Secondary | ICD-10-CM | POA: Diagnosis not present

## 2023-01-14 DIAGNOSIS — Z886 Allergy status to analgesic agent status: Secondary | ICD-10-CM | POA: Diagnosis not present

## 2023-01-14 DIAGNOSIS — Z0000 Encounter for general adult medical examination without abnormal findings: Secondary | ICD-10-CM | POA: Diagnosis not present

## 2023-01-14 DIAGNOSIS — H25813 Combined forms of age-related cataract, bilateral: Secondary | ICD-10-CM | POA: Diagnosis not present

## 2023-01-14 DIAGNOSIS — H2513 Age-related nuclear cataract, bilateral: Secondary | ICD-10-CM | POA: Diagnosis not present

## 2023-01-14 DIAGNOSIS — Z7983 Long term (current) use of bisphosphonates: Secondary | ICD-10-CM | POA: Diagnosis not present

## 2023-01-14 DIAGNOSIS — E785 Hyperlipidemia, unspecified: Secondary | ICD-10-CM | POA: Diagnosis not present

## 2023-01-14 DIAGNOSIS — H35363 Drusen (degenerative) of macula, bilateral: Secondary | ICD-10-CM | POA: Diagnosis not present

## 2023-01-14 DIAGNOSIS — H5461 Unqualified visual loss, right eye, normal vision left eye: Secondary | ICD-10-CM | POA: Diagnosis not present

## 2023-01-14 DIAGNOSIS — H4710 Unspecified papilledema: Secondary | ICD-10-CM | POA: Diagnosis not present

## 2023-01-14 DIAGNOSIS — H538 Other visual disturbances: Secondary | ICD-10-CM | POA: Diagnosis not present

## 2023-01-14 DIAGNOSIS — I2699 Other pulmonary embolism without acute cor pulmonale: Secondary | ICD-10-CM | POA: Diagnosis not present

## 2023-01-14 DIAGNOSIS — R6884 Jaw pain: Secondary | ICD-10-CM | POA: Diagnosis not present

## 2023-01-14 DIAGNOSIS — D509 Iron deficiency anemia, unspecified: Secondary | ICD-10-CM | POA: Diagnosis not present

## 2023-01-14 DIAGNOSIS — M818 Other osteoporosis without current pathological fracture: Secondary | ICD-10-CM | POA: Diagnosis not present

## 2023-01-14 DIAGNOSIS — D649 Anemia, unspecified: Secondary | ICD-10-CM | POA: Diagnosis not present

## 2023-01-14 DIAGNOSIS — H53432 Sector or arcuate defects, left eye: Secondary | ICD-10-CM | POA: Diagnosis not present

## 2023-01-14 DIAGNOSIS — R519 Headache, unspecified: Secondary | ICD-10-CM | POA: Diagnosis not present

## 2023-01-14 DIAGNOSIS — R634 Abnormal weight loss: Secondary | ICD-10-CM | POA: Diagnosis not present

## 2023-01-14 DIAGNOSIS — H40033 Anatomical narrow angle, bilateral: Secondary | ICD-10-CM | POA: Diagnosis not present

## 2023-01-14 DIAGNOSIS — M810 Age-related osteoporosis without current pathological fracture: Secondary | ICD-10-CM | POA: Diagnosis not present

## 2023-01-14 DIAGNOSIS — I10 Essential (primary) hypertension: Secondary | ICD-10-CM | POA: Diagnosis not present

## 2023-01-14 DIAGNOSIS — H469 Unspecified optic neuritis: Secondary | ICD-10-CM | POA: Diagnosis not present

## 2023-01-14 DIAGNOSIS — Z79899 Other long term (current) drug therapy: Secondary | ICD-10-CM | POA: Diagnosis not present

## 2023-01-14 DIAGNOSIS — J0130 Acute sphenoidal sinusitis, unspecified: Secondary | ICD-10-CM | POA: Diagnosis not present

## 2023-01-14 DIAGNOSIS — R7982 Elevated C-reactive protein (CRP): Secondary | ICD-10-CM | POA: Diagnosis not present

## 2023-01-14 DIAGNOSIS — Z86711 Personal history of pulmonary embolism: Secondary | ICD-10-CM | POA: Diagnosis not present

## 2023-01-14 DIAGNOSIS — A932 Colorado tick fever: Secondary | ICD-10-CM | POA: Diagnosis not present

## 2023-01-14 DIAGNOSIS — M316 Other giant cell arteritis: Secondary | ICD-10-CM | POA: Diagnosis not present

## 2023-01-14 DIAGNOSIS — Z7901 Long term (current) use of anticoagulants: Secondary | ICD-10-CM | POA: Diagnosis not present

## 2023-01-14 DIAGNOSIS — R700 Elevated erythrocyte sedimentation rate: Secondary | ICD-10-CM | POA: Diagnosis not present

## 2023-01-14 DIAGNOSIS — Z6824 Body mass index (BMI) 24.0-24.9, adult: Secondary | ICD-10-CM | POA: Diagnosis not present

## 2023-01-16 DIAGNOSIS — Z6824 Body mass index (BMI) 24.0-24.9, adult: Secondary | ICD-10-CM | POA: Diagnosis not present

## 2023-01-16 DIAGNOSIS — I10 Essential (primary) hypertension: Secondary | ICD-10-CM | POA: Diagnosis not present

## 2023-01-16 DIAGNOSIS — M818 Other osteoporosis without current pathological fracture: Secondary | ICD-10-CM | POA: Diagnosis not present

## 2023-01-16 DIAGNOSIS — H40033 Anatomical narrow angle, bilateral: Secondary | ICD-10-CM | POA: Diagnosis not present

## 2023-01-16 DIAGNOSIS — I2699 Other pulmonary embolism without acute cor pulmonale: Secondary | ICD-10-CM | POA: Diagnosis not present

## 2023-01-16 DIAGNOSIS — H2513 Age-related nuclear cataract, bilateral: Secondary | ICD-10-CM | POA: Diagnosis not present

## 2023-01-16 DIAGNOSIS — A932 Colorado tick fever: Secondary | ICD-10-CM | POA: Diagnosis not present

## 2023-01-16 DIAGNOSIS — R519 Headache, unspecified: Secondary | ICD-10-CM | POA: Diagnosis not present

## 2023-01-16 DIAGNOSIS — Z0000 Encounter for general adult medical examination without abnormal findings: Secondary | ICD-10-CM | POA: Diagnosis not present

## 2023-01-16 NOTE — Progress Notes (Signed)
[facility] - Clinic Note  01/17/2023   CHIEF COMPLAINT Patient presents for Retina Evaluation  HISTORY OF PRESENT ILLNESS: Julie Melton is a 77 y.o. female who presents to the clinic today for:  HPI     Retina Evaluation   In right eye.  This started 3 days ago.  Duration of 3 days.  Associated Symptoms Distortion.  Context:  distance vision, mid-range vision and near vision.  Response to treatment was no improvement.  I, the attending physician,  performed the HPI with the patient and updated documentation appropriately.        Comments   Pt here for ret eval due to decreased VA OD to 20/70, starting Sunday. Pt referred by Dr. Conley Rolls. Pt states Sunday she thought she was having a sinus headache but VA became blurrier in OD, just at the top. No floaters or FOL. Pt does report headache but that is gone today. She was started on pred and doxycycline yesterday per PCP for the vision problem.       Last edited by Rennis Chris, MD on 01/17/2023  3:58 PM.    Pt is here on the referral of Dr. Conley Rolls for concern of decreased VA OD, pt states she was at her daughters house on Saturday and got a bad headache on the right side of her head, her daughter took her to [facility] urgent [facility], on Sunday, she noticed her vision had decreased, so her daughter took her home, on Monday morning, her husband took her to her family dr and they put her on doxyclycline and Prednisone, pt states her PCP sent her to Dr. Conley Rolls, pt states Dr. Conley Rolls did not know what was wrong and referred her here, pt states her headache is gone today, pt had jaw pain as well as difficulty chewing, but all of that is resolved today   Referring physician: Michaelle Copas, MD [address] [place],  [place] [address]  HISTORICAL INFORMATION:  Selected notes from the MEDICAL RECORD NUMBER Referred by Dr. Despina Arias for decreased VA LEE:  Ocular Hx- PMH-   CURRENT MEDICATIONS: No current outpatient medications on file.  (Ophthalmic Drugs)   No current facility-administered medications for this visit. (Ophthalmic Drugs)   Current Outpatient Medications (Other)  Medication Sig   alendronate (FOSAMAX) 70 MG tablet Take 70 mg by mouth every Saturday.   Cholecalciferol (QC VITAMIN D3) 50 MCG (2000 UT) TABS Take 2,000 Units by mouth in the morning.   cyanocobalamin (VITAMIN B12) 1000 MCG tablet Take 1,000 mcg by mouth in the morning.   doxycycline (VIBRAMYCIN) 100 MG capsule Take 100 mg by mouth 2 (two) times daily.   ezetimibe (ZETIA) 10 MG tablet Take 10 mg by mouth every evening.   methylPREDNISolone (MEDROL DOSEPAK) 4 MG TBPK tablet Take by mouth.   simvastatin (ZOCOR) 20 MG tablet Take 20 mg by mouth every evening.   XARELTO 20 MG TABS tablet Take 20 mg by mouth in the morning.   No current facility-administered medications for this visit. (Other)   REVIEW OF SYSTEMS: ROS   Positive for: Musculoskeletal, Cardiovascular, Eyes, Heme/Lymph Negative for: Constitutional, Gastrointestinal, Neurological, Skin, Genitourinary, HENT, Endocrine, Respiratory, Psychiatric, Allergic/Imm Last edited by Thompson Grayer, COT on 01/17/2023  8:03 AM.     ALLERGIES Allergies  Allergen Reactions   Acetaminophen     Feels like her brain swells when taking   Aspirin     On xarelto   PAST MEDICAL  HISTORY Past Medical History:  Diagnosis Date   Cataract    Hyperlipemia    Osteopenia    Pulmonary embolism (HCC) 02/2022   Past Surgical History:  Procedure Laterality Date   BIOPSY  09/26/2022   Procedure: BIOPSY;  Surgeon: Lanelle Bal, DO;  [facility];  Service: Endoscopy;;   BREAST EXCISIONAL BIOPSY Right 2006   COLONOSCOPY WITH PROPOFOL N/A 09/26/2022   Procedure: COLONOSCOPY WITH PROPOFOL;  Surgeon: Lanelle Bal, DO;  [facility];  Service: Endoscopy;  Laterality: N/A;  11;15 AM   ESOPHAGOGASTRODUODENOSCOPY (EGD) WITH PROPOFOL N/A 09/26/2022   Procedure:  ESOPHAGOGASTRODUODENOSCOPY (EGD) WITH PROPOFOL;  Surgeon: Lanelle Bal, DO;  [facility];  Service: Endoscopy;  Laterality: N/A;   POLYPECTOMY  09/26/2022   Procedure: POLYPECTOMY;  Surgeon: Lanelle Bal, DO;  [facility];  Service: Endoscopy;;   FAMILY HISTORY Family History  Problem Relation Age of Onset   Breast cancer Mother    Stroke Mother    Cataracts Sister    Cataracts Brother    Colon cancer Neg Hx    Colon polyps Neg Hx    SOCIAL HISTORY Social History   Tobacco Use   Smoking status: Never    Passive exposure: Past   Smokeless tobacco: Never  Vaping Use   Vaping Use: Never used  Substance Use Topics   Alcohol use: Never   Drug use: Never       OPHTHALMIC EXAM:  Base Eye Exam     Visual Acuity (Snellen - Linear)       Right Left   Dist cc 20/100 +2 20/25   Dist ph cc 20/80 -1 NI    Correction: Glasses  Tilted head back for OD acuity MS        Tonometry (Tonopen, 8:17 AM)       Right Left   Pressure 13 13         Pupils       Pupils Dark Light Shape React APD   Right PERRL 3 2 Round Brisk None   Left PERRL 3 2 Round Brisk None         Visual Fields       Left Right    Full    Restrictions  Total superior nasal deficiency; Partial inner superior temporal deficiency         Extraocular Movement       Right Left    Full, Ortho Full, Ortho         Neuro/Psych     Oriented x3: Yes   Mood/Affect: Normal         Dilation     Both eyes: 1.0% Mydriacyl, 2.5% Phenylephrine @ 8:18 AM           Slit Lamp and Fundus Exam     Slit Lamp Exam       Right Left   Lids/Lashes Dermatochalasis - upper lid, mild MGD Dermatochalasis - upper lid, mild MGD   Conjunctiva/Sclera White and quiet White and quiet   Cornea tear film debris 1+ Punctate epithelial erosions, tear film debris   Anterior Chamber deep, clear, narrow temporal angle deep, clear, narrow temporal angle   Iris Round and dilated  Round and dilated   Lens 2-3+ Nuclear sclerosis, 2-3+ Cortical cataract 2-3+ Nuclear sclerosis, 2-3+ Cortical cataract   Anterior Vitreous mild syneresis mild syneresis         Fundus Exam  Right Left   Disc 2+disc edema -- no heme or obscuration of vessels Pink and Sharp, focal PPA   C/D Ratio 0.0 0.2   Macula Flat, Blunted foveal reflex, fine drusen, RPE mottling, No heme or edema Flat, Blunted foveal reflex, RPE mottling and clumping, No heme or edema   Vessels attenuated, mild tortuosity, mild AV crossing changes attenuated, mild tortuosity, mild AV crossing changes   Periphery Attached Attached           Refraction     Wearing Rx       Sphere Cylinder Axis Add   Right +0.50 +0.50 161 +1.25   Left +2.00 Sphere  +1.25  Rx specs from Sept 2023        Manifest Refraction       Sphere Cylinder Axis Dist VA   Right +0.50 +0.50 105 20/80-2   Left               IMAGING AND PROCEDURES  Imaging and Procedures for 01/17/2023  OCT, Retina - OU - Both Eyes       Right Eye Quality was good. Central Foveal Thickness: 268. Progression has no prior data. Findings include normal foveal contour, no IRF, no SRF, retinal drusen (+disc edema, partial PVD).   Left Eye Quality was good. Central Foveal Thickness: 273. Progression has no prior data. Findings include normal foveal contour, no IRF, no SRF, retinal drusen , vitreomacular adhesion (No disc edema).   Notes *Images captured and stored on drive  Diagnosis / Impression:  NFP; no IRF/SRF OU Retinal drusen OU OD: +optic disc edema    Clinical management:  See below  Abbreviations: NFP - Normal foveal profile. CME - cystoid macular edema. PED - pigment epithelial detachment. IRF - intraretinal fluid. SRF - subretinal fluid. EZ - ellipsoid zone. ERM - epiretinal membrane. ORA - outer retinal atrophy. ORT - outer retinal tubulation. SRHM - subretinal hyper-reflective material. 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 Combined forms of age-related cataract of both eyes  H25.813      Optic disc edema OD  - pt reports decreased vision OD w/ +R temporal headache and R jaw pain/claudication, onset Saturday, 05.11.24.  - went to [facility] on Saturday 05.11.24 -- ESR mildly elevated  - headache persisted, so pt presented to PCP on Monday, 05.13.24 -- given medrol dose pack and doxycycline  - pt also saw Dr. Conley Rolls on 05.13.24 due to decreased vision OD who then referred here for further eval  - here BCVA OD 20/80 and dilated exam showed 2+ optic disc edema  - +disc edema w/ elevated ESR, temporal headache, and jaw pain/claudication raises significant clinical suspicion of GCA / temporal arteritis  - discussed findings, prognosis and treatment options  - recommend presentation to [facility] ED for further evaluation by Neuro-Ophth and further work-up/management of GCA  - pt given summary note from [place] to share with ED / other consultants  - f/u here in 6 wks  2. Retinal drusen OU  - monitor  3. Mixed Cataract OU - The symptoms of cataract, surgical options, and treatments and risks were discussed with patient. - discussed diagnosis and progression - monitor   Ophthalmic Meds Ordered this visit:  No  orders of the defined types were placed in this encounter.    Return in about 6 weeks (around 02/28/2023) for f/u disc edema OD, DFE, OCT.  There are no Patient Instructions on file for this visit.  Explained the diagnoses, plan, and follow up with the patient and they expressed understanding.  Patient expressed understanding of the importance of proper follow up care.   This document serves as a record of services personally performed by Karie Chimera, MD, PhD. It was created on their behalf by De Blanch, an  ophthalmic technician. The creation of this record is the provider's dictation and/or activities during the visit.    Electronically signed by: De Blanch, OA, 01/17/23  4:12 PM  This document serves as a record of services personally performed by Karie Chimera, MD, PhD. It was created on their behalf by Glee Arvin. Manson Passey, OA an ophthalmic technician. The creation of this record is the provider's dictation and/or activities during the visit.    Electronically signed by: Glee Arvin. Manson Passey, [place] 05.14.2024 4:12 PM  Karie Chimera, M.D., Ph.D. Diseases & Surgery of the Retina and Vitreous [facility] 01/17/2023  I have reviewed the above documentation for accuracy and completeness, and I agree with the above. Karie Chimera, M.D., Ph.D. 01/17/23 4:12 PM   Abbreviations: M myopia (nearsighted); A astigmatism; H hyperopia (farsighted); P presbyopia; Mrx spectacle prescription;  CTL contact lenses; OD right eye; OS left eye; OU both eyes  XT exotropia; ET esotropia; PEK punctate epithelial keratitis; PEE punctate epithelial erosions; DES dry eye syndrome; MGD meibomian gland dysfunction; ATs artificial tears; PFAT's preservative free artificial tears; NSC nuclear sclerotic cataract; PSC posterior subcapsular cataract; ERM epi-retinal membrane; PVD posterior vitreous detachment; RD retinal detachment; DM diabetes mellitus; DR diabetic retinopathy; NPDR non-proliferative diabetic retinopathy; PDR proliferative diabetic retinopathy; CSME clinically significant macular edema; DME diabetic macular edema; dbh dot blot hemorrhages; CWS cotton wool spot; POAG primary open angle glaucoma; C/D cup-to-disc ratio; HVF humphrey visual field; GVF goldmann visual field; OCT optical coherence tomography; IOP intraocular pressure; BRVO Branch retinal vein occlusion; CRVO central retinal vein occlusion; CRAO central retinal artery occlusion; BRAO branch retinal artery occlusion; RT retinal tear;  SB scleral buckle; PPV pars plana vitrectomy; VH Vitreous hemorrhage; PRP panretinal laser photocoagulation; IVK intravitreal kenalog; VMT vitreomacular traction; MH Macular hole;  NVD neovascularization of the disc; NVE neovascularization elsewhere; AREDS age related eye disease study; ARMD age related macular degeneration; POAG primary open angle glaucoma; EBMD epithelial/anterior basement membrane dystrophy; ACIOL anterior chamber intraocular lens; IOL intraocular lens; PCIOL posterior chamber intraocular lens; Phaco/IOL phacoemulsification with intraocular lens placement; PRK photorefractive keratectomy; LASIK laser assisted in situ keratomileusis; HTN hypertension; DM diabetes mellitus; COPD chronic obstructive pulmonary disease

## 2023-01-17 ENCOUNTER — Encounter (INDEPENDENT_AMBULATORY_CARE_PROVIDER_SITE_OTHER): Payer: Self-pay | Admitting: Ophthalmology

## 2023-01-17 ENCOUNTER — Ambulatory Visit (INDEPENDENT_AMBULATORY_CARE_PROVIDER_SITE_OTHER): Payer: Medicare Other | Admitting: Ophthalmology

## 2023-01-17 DIAGNOSIS — Z7983 Long term (current) use of bisphosphonates: Secondary | ICD-10-CM | POA: Diagnosis not present

## 2023-01-17 DIAGNOSIS — H25813 Combined forms of age-related cataract, bilateral: Secondary | ICD-10-CM | POA: Diagnosis not present

## 2023-01-17 DIAGNOSIS — H53432 Sector or arcuate defects, left eye: Secondary | ICD-10-CM | POA: Diagnosis not present

## 2023-01-17 DIAGNOSIS — R519 Headache, unspecified: Secondary | ICD-10-CM | POA: Diagnosis not present

## 2023-01-17 DIAGNOSIS — H538 Other visual disturbances: Secondary | ICD-10-CM | POA: Diagnosis not present

## 2023-01-17 DIAGNOSIS — J0130 Acute sphenoidal sinusitis, unspecified: Secondary | ICD-10-CM | POA: Diagnosis not present

## 2023-01-17 DIAGNOSIS — H5461 Unqualified visual loss, right eye, normal vision left eye: Secondary | ICD-10-CM | POA: Diagnosis not present

## 2023-01-17 DIAGNOSIS — R6884 Jaw pain: Secondary | ICD-10-CM | POA: Diagnosis not present

## 2023-01-17 DIAGNOSIS — H469 Unspecified optic neuritis: Secondary | ICD-10-CM | POA: Diagnosis not present

## 2023-01-17 DIAGNOSIS — Z7901 Long term (current) use of anticoagulants: Secondary | ICD-10-CM | POA: Diagnosis not present

## 2023-01-17 DIAGNOSIS — H4710 Unspecified papilledema: Secondary | ICD-10-CM

## 2023-01-17 DIAGNOSIS — M810 Age-related osteoporosis without current pathological fracture: Secondary | ICD-10-CM | POA: Diagnosis not present

## 2023-01-17 DIAGNOSIS — H3581 Retinal edema: Secondary | ICD-10-CM

## 2023-01-17 DIAGNOSIS — E785 Hyperlipidemia, unspecified: Secondary | ICD-10-CM | POA: Diagnosis not present

## 2023-01-17 DIAGNOSIS — M316 Other giant cell arteritis: Secondary | ICD-10-CM | POA: Diagnosis not present

## 2023-01-17 DIAGNOSIS — R700 Elevated erythrocyte sedimentation rate: Secondary | ICD-10-CM | POA: Diagnosis not present

## 2023-01-17 DIAGNOSIS — D649 Anemia, unspecified: Secondary | ICD-10-CM | POA: Diagnosis not present

## 2023-01-17 DIAGNOSIS — D509 Iron deficiency anemia, unspecified: Secondary | ICD-10-CM | POA: Diagnosis not present

## 2023-01-17 DIAGNOSIS — R7982 Elevated C-reactive protein (CRP): Secondary | ICD-10-CM | POA: Diagnosis not present

## 2023-01-17 DIAGNOSIS — Z86711 Personal history of pulmonary embolism: Secondary | ICD-10-CM | POA: Diagnosis not present

## 2023-01-17 DIAGNOSIS — H35363 Drusen (degenerative) of macula, bilateral: Secondary | ICD-10-CM | POA: Diagnosis not present

## 2023-01-17 DIAGNOSIS — R634 Abnormal weight loss: Secondary | ICD-10-CM | POA: Diagnosis not present

## 2023-01-17 DIAGNOSIS — Z79899 Other long term (current) drug therapy: Secondary | ICD-10-CM | POA: Diagnosis not present

## 2023-01-17 DIAGNOSIS — Z886 Allergy status to analgesic agent status: Secondary | ICD-10-CM | POA: Diagnosis not present

## 2023-01-18 DIAGNOSIS — M316 Other giant cell arteritis: Secondary | ICD-10-CM | POA: Diagnosis not present

## 2023-01-18 DIAGNOSIS — H53432 Sector or arcuate defects, left eye: Secondary | ICD-10-CM | POA: Diagnosis not present

## 2023-01-18 DIAGNOSIS — H35363 Drusen (degenerative) of macula, bilateral: Secondary | ICD-10-CM | POA: Diagnosis not present

## 2023-01-20 DIAGNOSIS — M316 Other giant cell arteritis: Secondary | ICD-10-CM | POA: Diagnosis not present

## 2023-02-09 DIAGNOSIS — M316 Other giant cell arteritis: Secondary | ICD-10-CM | POA: Diagnosis not present

## 2023-02-09 DIAGNOSIS — Z6823 Body mass index (BMI) 23.0-23.9, adult: Secondary | ICD-10-CM | POA: Diagnosis not present

## 2023-02-09 DIAGNOSIS — I10 Essential (primary) hypertension: Secondary | ICD-10-CM | POA: Diagnosis not present

## 2023-02-10 ENCOUNTER — Encounter: Payer: Self-pay | Admitting: Gastroenterology

## 2023-02-14 NOTE — Progress Notes (Signed)
[facility] - Clinic Note  02/28/2023   CHIEF COMPLAINT Patient presents for Retina Follow Up  HISTORY OF PRESENT ILLNESS: Julie Melton is a 77 y.o. female who presents to the clinic today for:  HPI     Retina Follow Up   Patient presents with  Other.  In right eye.  This started 6 weeks ago.        Comments   Patient here for 6 weeks retina follow up for disc edema OD. Patient states vision still has a little blur across top of OD. OS is fine. Did biopsy and suture  so won't spread across to OS. No eye pain.      Last edited by Laddie Aquas, COA on 02/28/2023  9:27 AM.     She is on a taper of Pred.  She has noticed an improvement in vision.   Referring physician: Toma Deiters, MD [address] [place],  [place] [address]  HISTORICAL INFORMATION:  Selected notes from the MEDICAL RECORD NUMBER Referred by Dr. Despina Arias for decreased VA LEE:  Ocular Hx- PMH-   CURRENT MEDICATIONS: No current outpatient medications on file. (Ophthalmic Drugs)   No current facility-administered medications for this visit. (Ophthalmic Drugs)   Current Outpatient Medications (Other)  Medication Sig   alendronate (FOSAMAX) 70 MG tablet Take 70 mg by mouth every Saturday.   Cholecalciferol (QC VITAMIN D3) 50 MCG (2000 UT) TABS Take 2,000 Units by mouth in the morning.   cyanocobalamin (VITAMIN B12) 1000 MCG tablet Take 1,000 mcg by mouth in the morning.   doxycycline (VIBRAMYCIN) 100 MG capsule Take 100 mg by mouth 2 (two) times daily.   ezetimibe (ZETIA) 10 MG tablet Take 10 mg by mouth every evening.   methylPREDNISolone (MEDROL DOSEPAK) 4 MG TBPK tablet Take by mouth.   simvastatin (ZOCOR) 20 MG tablet Take 20 mg by mouth every evening.   XARELTO 20 MG TABS tablet Take 20 mg by mouth in the morning.   No current facility-administered medications for this visit. (Other)   REVIEW OF SYSTEMS: ROS   Positive for: Musculoskeletal, Cardiovascular, Eyes,  Heme/Lymph Negative for: Constitutional, Gastrointestinal, Neurological, Skin, Genitourinary, HENT, Endocrine, Respiratory, Psychiatric, Allergic/Imm Last edited by Laddie Aquas, COA on 02/28/2023  9:27 AM.      ALLERGIES Allergies  Allergen Reactions   Acetaminophen     Feels like her brain swells when taking   Aspirin     On xarelto   PAST MEDICAL HISTORY Past Medical History:  Diagnosis Date   Cataract    Hyperlipemia    Osteopenia    Pulmonary embolism (HCC) 02/2022   Past Surgical History:  Procedure Laterality Date   BIOPSY  09/26/2022   Procedure: BIOPSY;  Surgeon: Lanelle Bal, DO;  [facility];  Service: Endoscopy;;   BREAST EXCISIONAL BIOPSY Right 2006   COLONOSCOPY WITH PROPOFOL N/A 09/26/2022   Procedure: COLONOSCOPY WITH PROPOFOL;  Surgeon: Lanelle Bal, DO;  [facility];  Service: Endoscopy;  Laterality: N/A;  11;15 AM   ESOPHAGOGASTRODUODENOSCOPY (EGD) WITH PROPOFOL N/A 09/26/2022   Procedure: ESOPHAGOGASTRODUODENOSCOPY (EGD) WITH PROPOFOL;  Surgeon: Lanelle Bal, DO;  [facility];  Service: Endoscopy;  Laterality: N/A;   POLYPECTOMY  09/26/2022   Procedure: POLYPECTOMY;  Surgeon: Lanelle Bal, DO;  [facility];  Service: Endoscopy;;   FAMILY HISTORY Family History  Problem Relation Age of Onset   Breast cancer  Mother    Stroke Mother    Cataracts Sister    Cataracts Brother    Colon cancer Neg Hx    Colon polyps Neg Hx    SOCIAL HISTORY Social History   Tobacco Use   Smoking status: Never    Passive exposure: Past   Smokeless tobacco: Never  Vaping Use   Vaping Use: Never used  Substance Use Topics   Alcohol use: Never   Drug use: Never       OPHTHALMIC EXAM:  Base Eye Exam     Visual Acuity (Snellen - Linear)       Right Left   Dist cc 20/60 20/25   Dist ph cc 20/40 20/20 -2    Correction: Glasses         Tonometry (Tonopen, 9:24 AM)       Right Left    Pressure 12 12         Pupils       Dark Light Shape React APD   Right 3 2 Round Brisk None   Left 3 2 Round Brisk None         Visual Fields (Counting fingers)       Left Right    Full    Restrictions  Total superior nasal deficiency; Partial inner superior temporal deficiency         Extraocular Movement       Right Left    Full, Ortho Full, Ortho         Neuro/Psych     Oriented x3: Yes   Mood/Affect: Normal         Dilation     Both eyes: 1.0% Mydriacyl, 2.5% Phenylephrine @ 9:24 AM           Slit Lamp and Fundus Exam     Slit Lamp Exam       Right Left   Lids/Lashes Dermatochalasis - upper lid, mild MGD Dermatochalasis - upper lid, mild MGD   Conjunctiva/Sclera White and quiet White and quiet   Cornea tear film debris 1+ Punctate epithelial erosions, tear film debris   Anterior Chamber deep, clear, narrow temporal angle deep, clear, narrow temporal angle   Iris Round and dilated Round and dilated   Lens 2-3+ Nuclear sclerosis, 2-3+ Cortical cataract 2-3+ Nuclear sclerosis, 2-3+ Cortical cataract   Anterior Vitreous mild syneresis mild syneresis         Fundus Exam       Right Left   Disc 2+disc edema -- no heme or obscuration of vessels Pink and Sharp, focal PPA   C/D Ratio 0.0 0.2   Macula Flat, Blunted foveal reflex, fine drusen, RPE mottling, No heme or edema Flat, Blunted foveal reflex, RPE mottling and clumping, No heme or edema   Vessels attenuated, mild tortuosity, mild AV crossing changes attenuated, mild tortuosity, mild AV crossing changes   Periphery Attached Attached           Refraction     Wearing Rx       Sphere Cylinder Axis Add   Right +0.50 +0.50 161 +1.25   Left +2.00 Sphere  +1.25           IMAGING AND PROCEDURES  Imaging and Procedures for 02/28/2023  OCT, Retina - OU - Both Eyes       Right Eye Quality was good. Central Foveal Thickness: 267. Progression has no prior data. Findings include  normal foveal contour, no IRF, no SRF, retinal drusen (+disc edema,  partial PVD).   Left Eye Quality was good. Central Foveal Thickness: 269. Progression has no prior data. Findings include normal foveal contour, no IRF, no SRF, retinal drusen , vitreomacular adhesion (No disc edema).   Notes *Images captured and stored on drive  Diagnosis / Impression:  NFP; no IRF/SRF OU Retinal drusen OU OD: +optic disc edema    Clinical management:  See below  Abbreviations: NFP - Normal foveal profile. CME - cystoid macular edema. PED - pigment epithelial detachment. IRF - intraretinal fluid. SRF - subretinal fluid. EZ - ellipsoid zone. ERM - epiretinal membrane. ORA - outer retinal atrophy. ORT - outer retinal tubulation. SRHM - subretinal hyper-reflective material. 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 Combined forms of age-related cataract of both eyes  H25.813      Optic disc edema OD - pt reports decreased vision OD w/ +R temporal headache and R jaw pain/claudication, onset Saturday, 05.11.24.  - went to [facility] on Saturday 05.11.24 -- ESR mildly elevated - headache persisted, so pt presented to PCP on Monday, 05.13.24 -- given medrol dose pack and doxycycline - pt also saw Dr. 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 Retinal drusen OU  - monitor  3. Mixed Cataract OU - The symptoms of cataract, surgical options, and treatments and risks were discussed with patient. - discussed diagnosis and progression - monitor   Ophthalmic Meds Ordered this visit:  No orders of the defined types were placed in this encounter.    No follow-ups on file.  There are no Patient Instructions on file for this visit.  Explained the diagnoses, plan, and follow up with the patient and they expressed understanding.  Patient expressed understanding of the importance of proper follow up care.  This document serves as a record of services personally performed by Karie Chimera, MD, PhD. It was created on their behalf by Gerilyn Nestle, COT an ophthalmic technician. The creation of this record is the provider's dictation and/or activities during the visit.    Electronically signed by:  Gerilyn Nestle, COT  6.11.24  10:25 AM  This document serves as a record of services personally performed by Karie Chimera, MD, PhD. It was created on their behalf by Gerilyn Nestle, COT an ophthalmic technician. The creation of this record is the provider's dictation and/or activities during the visit.    Electronically signed by:  Charlette Caffey, COT  02/28/23 10:25 AM   Karie Chimera, M.D., Ph.D. Diseases & Surgery of the Retina and Vitreous [facility]    Abbreviations: M myopia (nearsighted); A astigmatism; H hyperopia (farsighted); P presbyopia; Mrx spectacle prescription;  CTL contact lenses; OD right  eye; OS left eye; OU both eyes  XT exotropia; ET esotropia; PEK punctate epithelial keratitis; PEE punctate epithelial erosions; DES dry eye syndrome; MGD meibomian gland dysfunction; ATs artificial tears; PFAT's preservative free artificial tears; NSC nuclear sclerotic cataract; PSC posterior subcapsular cataract; ERM epi-retinal membrane; PVD posterior vitreous  detachment; RD retinal detachment; DM diabetes mellitus; DR diabetic retinopathy; NPDR non-proliferative diabetic retinopathy; PDR proliferative diabetic retinopathy; CSME clinically significant macular edema; DME diabetic macular edema; dbh dot blot hemorrhages; CWS cotton wool spot; POAG primary open angle glaucoma; C/D cup-to-disc ratio; HVF humphrey visual field; GVF goldmann visual field; OCT optical coherence tomography; IOP intraocular pressure; BRVO Branch retinal vein occlusion; CRVO central retinal vein occlusion; CRAO central retinal artery occlusion; BRAO branch retinal artery occlusion; RT retinal tear; SB scleral buckle; PPV pars plana vitrectomy; VH Vitreous hemorrhage; PRP panretinal laser photocoagulation; IVK intravitreal kenalog; VMT vitreomacular traction; MH Macular hole;  NVD neovascularization of the disc; NVE neovascularization elsewhere; AREDS age related eye disease study; ARMD age related macular degeneration; POAG primary open angle glaucoma; EBMD epithelial/anterior basement membrane dystrophy; ACIOL anterior chamber intraocular lens; IOL intraocular lens; PCIOL posterior chamber intraocular lens; Phaco/IOL phacoemulsification with intraocular lens placement; PRK photorefractive keratectomy; LASIK laser assisted in situ keratomileusis; HTN hypertension; DM diabetes mellitus; COPD chronic obstructive pulmonary disease

## 2023-02-23 DIAGNOSIS — M316 Other giant cell arteritis: Secondary | ICD-10-CM | POA: Diagnosis not present

## 2023-02-28 ENCOUNTER — Ambulatory Visit (INDEPENDENT_AMBULATORY_CARE_PROVIDER_SITE_OTHER): Payer: Medicare Other | Admitting: Ophthalmology

## 2023-02-28 ENCOUNTER — Encounter (INDEPENDENT_AMBULATORY_CARE_PROVIDER_SITE_OTHER): Payer: Self-pay | Admitting: Ophthalmology

## 2023-02-28 DIAGNOSIS — M316 Other giant cell arteritis: Secondary | ICD-10-CM

## 2023-02-28 DIAGNOSIS — H4710 Unspecified papilledema: Secondary | ICD-10-CM | POA: Diagnosis not present

## 2023-02-28 DIAGNOSIS — H25813 Combined forms of age-related cataract, bilateral: Secondary | ICD-10-CM

## 2023-02-28 DIAGNOSIS — H35363 Drusen (degenerative) of macula, bilateral: Secondary | ICD-10-CM

## 2023-03-01 ENCOUNTER — Encounter (INDEPENDENT_AMBULATORY_CARE_PROVIDER_SITE_OTHER): Payer: Self-pay | Admitting: Ophthalmology

## 2023-03-14 DIAGNOSIS — I2699 Other pulmonary embolism without acute cor pulmonale: Secondary | ICD-10-CM | POA: Diagnosis not present

## 2023-03-14 DIAGNOSIS — E7849 Other hyperlipidemia: Secondary | ICD-10-CM | POA: Diagnosis not present

## 2023-03-14 DIAGNOSIS — M818 Other osteoporosis without current pathological fracture: Secondary | ICD-10-CM | POA: Diagnosis not present

## 2023-03-14 DIAGNOSIS — M316 Other giant cell arteritis: Secondary | ICD-10-CM | POA: Diagnosis not present

## 2023-03-14 DIAGNOSIS — I10 Essential (primary) hypertension: Secondary | ICD-10-CM | POA: Diagnosis not present

## 2023-03-14 DIAGNOSIS — Z6822 Body mass index (BMI) 22.0-22.9, adult: Secondary | ICD-10-CM | POA: Diagnosis not present

## 2023-03-28 ENCOUNTER — Encounter: Payer: Self-pay | Admitting: Gastroenterology

## 2023-03-28 ENCOUNTER — Ambulatory Visit: Payer: Medicare Other | Admitting: Gastroenterology

## 2023-03-28 VITALS — BP 118/53 | HR 106 | Temp 98.6°F | Ht 61.5 in | Wt 138.4 lb

## 2023-03-28 DIAGNOSIS — R9389 Abnormal findings on diagnostic imaging of other specified body structures: Secondary | ICD-10-CM | POA: Diagnosis not present

## 2023-03-28 NOTE — Patient Instructions (Signed)
We will see you back as needed!  Please call with any concerns. As we discussed, the results are disconcordant with each other from the elastography. Typically, I would order further blood work to sort this out; we can hold off on this as you want to avoid further testing. Please call with any changes!  I enjoyed seeing you again today! I value our relationship and want to provide genuine, compassionate, and quality care. You may receive a survey regarding your visit with me, and I welcome your feedback! Thanks so much for taking the time to complete this. I look forward to seeing you again.      Gelene Mink, PhD, ANP-BC [facility]

## 2023-03-28 NOTE — Progress Notes (Signed)
Gastroenterology Office Note     Primary Care Physician:  Toma Deiters, MD  Primary Gastroenterologist: Dr. Marletta Lor    Chief Complaint   Chief Complaint  Patient presents with   Follow-up    Follow up after abnormal [place]     History of Present Illness   Julie Melton is a 76 y.o. female presenting today in follow-up after colonoscopy and EGD. She was initially seen Nov 2023 for screening colonoscopy. She was incidentally found to have subtle nodularity of the liver in [place] in Oct 2023. No known history of liver disease. No FH of liver disease, colon cancer, polyps.   Elastography showed no significant fibrosis, but the sonographic appearance of liver appeared to have nodular contour. US abdomen complete in Oct 2023 with subtle nodulatory of hepatic contour. No thrombocytopenia. EGD without stigmata of portal hypertension. Hep A immune, Hep B surface antigen negative, Hep C negative, AFP tumor marker normal, iron sats low at 8, ferritin 28,  ALT recently 48, AST 40 (June 2024).    Recently diagnosed with giant cell arteritis. Had surgery Jan 20, 2023.  She denies any abdominal pain, N/V, changes in diet, dysphagia. No overt GI bleeding. Multiple polyps on colonoscopy but no surveillance needed due to age. She does not want to pursue any further testing whatsoever.   EGD Jan 2024 Small hiatal hernia.                           - Non-obstructing Schatzki ring.                           - Normal stomach.                           - Duodenal lipoma.                           - No specimens collected  Colonoscopy Jan 2024: multiple polyps, nodular mucosa in ascending colon and cecum s/p biopsy, internal hemorrhoids. Nodular mucosa with focal colitis. Adenomas. No survelilance due to age.     Past Medical History:  Diagnosis Date   Cataract    Hyperlipemia    Osteopenia    Pulmonary embolism (HCC) 02/2022    Past Surgical History:  Procedure Laterality Date   BIOPSY   09/26/2022   Procedure: BIOPSY;  Surgeon: Lanelle Bal, DO;  [facility];  Service: Endoscopy;;   BREAST EXCISIONAL BIOPSY Right 2006   COLONOSCOPY WITH PROPOFOL N/A 09/26/2022   Procedure: COLONOSCOPY WITH PROPOFOL;  Surgeon: Lanelle Bal, DO;  [facility];  Service: Endoscopy;  Laterality: N/A;  11;15 AM   ESOPHAGOGASTRODUODENOSCOPY (EGD) WITH PROPOFOL N/A 09/26/2022   Procedure: ESOPHAGOGASTRODUODENOSCOPY (EGD) WITH PROPOFOL;  Surgeon: Lanelle Bal, DO;  [facility];  Service: Endoscopy;  Laterality: N/A;   POLYPECTOMY  09/26/2022   Procedure: POLYPECTOMY;  Surgeon: Lanelle Bal, DO;  [facility];  Service: Endoscopy;;    Current Outpatient Medications  Medication Sig Dispense Refill   alendronate (FOSAMAX) 70 MG tablet Take 70 mg by mouth every Saturday.     Cholecalciferol (QC VITAMIN D3) 50 MCG (2000 UT) TABS Take 2,000 Units by mouth in the morning.     cyanocobalamin (VITAMIN B12) 1000 MCG tablet Take 1,000 mcg by  mouth in the morning.     ezetimibe (ZETIA) 10 MG tablet Take 10 mg by mouth every evening.     famotidine (PEPCID) 20 MG tablet Take 20 mg by mouth 2 (two) times daily.     fluticasone (FLONASE) 50 MCG/ACT nasal spray Place into both nostrils daily.     folic acid (FOLVITE) 1 MG tablet Take 1 mg by mouth daily.     simvastatin (ZOCOR) 20 MG tablet Take 20 mg by mouth every evening.     XARELTO 20 MG TABS tablet Take 20 mg by mouth in the morning.     doxycycline (VIBRAMYCIN) 100 MG capsule Take 100 mg by mouth 2 (two) times daily. (Patient not taking: Reported on 03/28/2023)     methylPREDNISolone (MEDROL DOSEPAK) 4 MG TBPK tablet Take by mouth. (Patient not taking: Reported on 03/28/2023)     No current facility-administered medications for this visit.    Allergies as of 03/28/2023 - Review Complete 03/28/2023  Allergen Reaction Noted   Acetaminophen  07/27/2022   Aspirin  07/27/2022    Family History   Problem Relation Age of Onset   Breast cancer Mother    Stroke Mother    Cataracts Sister    Cataracts Brother    Colon cancer Neg Hx    Colon polyps Neg Hx     Social History   Socioeconomic History   Marital status: Married    Spouse name: Not on file   Number of children: Not on file   Years of education: Not on file   Highest education level: Not on file  Occupational History   Not on file  Tobacco Use   Smoking status: Never    Passive exposure: Past   Smokeless tobacco: Never  Vaping Use   Vaping status: Never Used  Substance and Sexual Activity   Alcohol use: Never   Drug use: Never   Sexual activity: Not Currently  Other Topics Concern   Not on file  Social History Narrative   Not on file   Social Determinants of Health   Financial Resource Strain: Low Risk  (01/21/2023)   Received from [facility], [facility]   Overall Financial Resource Strain (CARDIA)    Difficulty of Paying Living Expenses: Not hard at all  Food Insecurity: No Food Insecurity (02/23/2023)   Received from [facility], [facility]   Hunger Vital Sign    Worried About Running Out of Food in the Last Year: Never true    Ran Out of Food in the Last Year: Never true  Transportation Needs: No Transportation Needs (02/23/2023)   Received from [facility], [facility]   [facility] - Transportation    In the past 12 months, has lack of transportation kept you from medical appointments or from getting medications?: No    Lack of Transportation (Non-Medical): No  Physical Activity: Not on file  Stress: Not on file  Social Connections: Not on file  Intimate Partner Violence: Not At Risk (07/13/2022)   Received from [facility], [facility]   Humiliation, Afraid, Rape, and Kick questionnaire    Fear of Current or Ex-Partner: No    Emotionally Abused: No    Physically Abused: No     Sexually Abused: No     Review of Systems   Gen: Denies any fever, chills, fatigue, weight loss, lack of appetite.  CV: Denies chest pain, heart palpitations,  peripheral edema, syncope.  Resp: Denies shortness of breath at rest or with exertion. Denies wheezing or cough.  GI: Denies dysphagia or odynophagia. Denies jaundice, hematemesis, fecal incontinence. GU : Denies urinary burning, urinary frequency, urinary hesitancy MS: Denies joint pain, muscle weakness, cramps, or limitation of movement.  Derm: Denies rash, itching, dry skin Psych: Denies depression, anxiety, memory loss, and confusion Heme: Denies bruising, bleeding, and enlarged lymph nodes.   Physical Exam   BP (!) 118/53   Pulse (!) 106   Temp 98.6 F (37 C)   Ht 5' 1.5" (1.562 m)   Wt 138 lb 6.4 oz (62.8 kg)   BMI 25.73 kg/m  General:   Alert and oriented. Pleasant and cooperative. Well-nourished and well-developed.  Head:  Normocephalic and atraumatic. Eyes:  Without icterus Abdomen:  +BS, soft, non-tender and non-distended. No HSM noted. No guarding or rebound. No masses appreciated.  Rectal:  Deferred  Msk:  Symmetrical without gross deformities. Normal posture. Extremities:  Without edema. Neurologic:  Alert and  oriented x4;  grossly normal neurologically. Skin:  Intact without significant lesions or rashes. Psych:  Alert and cooperative. Normal mood and affect.   Assessment   Julie Melton is a 77 y.o. female presenting today in follow-up after [place] due to concern for underlying cirrhosis. Colonoscopy recently with polyps but no surveillance needed due to age.  Elastography without significant fibrosis, but the liver appeared to have nodular contour. No stigmata of portal hypertension on EGD or thrombocytopenia. Disconcordant findings. At this time, she does not want any further evaluation despite our discussion today. She is aware that further labs and serologies would be indicated at this time, but  she would like a conservative approach. Declining serial ultrasounds as well.      PLAN   Return prn Recommend further evaluation if patient willing in future    Gelene Mink, PhD, [facility]

## 2023-05-03 DIAGNOSIS — M316 Other giant cell arteritis: Secondary | ICD-10-CM | POA: Diagnosis not present

## 2023-05-04 DIAGNOSIS — Z6824 Body mass index (BMI) 24.0-24.9, adult: Secondary | ICD-10-CM | POA: Diagnosis not present

## 2023-05-04 DIAGNOSIS — M818 Other osteoporosis without current pathological fracture: Secondary | ICD-10-CM | POA: Diagnosis not present

## 2023-05-04 DIAGNOSIS — E7849 Other hyperlipidemia: Secondary | ICD-10-CM | POA: Diagnosis not present

## 2023-05-04 DIAGNOSIS — M316 Other giant cell arteritis: Secondary | ICD-10-CM | POA: Diagnosis not present

## 2023-05-04 DIAGNOSIS — I10 Essential (primary) hypertension: Secondary | ICD-10-CM | POA: Diagnosis not present

## 2023-05-04 DIAGNOSIS — I2699 Other pulmonary embolism without acute cor pulmonale: Secondary | ICD-10-CM | POA: Diagnosis not present

## 2023-05-31 DIAGNOSIS — M316 Other giant cell arteritis: Secondary | ICD-10-CM | POA: Diagnosis not present

## 2023-06-28 DIAGNOSIS — M316 Other giant cell arteritis: Secondary | ICD-10-CM | POA: Diagnosis not present

## 2023-07-04 DIAGNOSIS — Z7901 Long term (current) use of anticoagulants: Secondary | ICD-10-CM | POA: Diagnosis not present

## 2023-07-04 DIAGNOSIS — E785 Hyperlipidemia, unspecified: Secondary | ICD-10-CM | POA: Diagnosis not present

## 2023-07-04 DIAGNOSIS — Z79899 Other long term (current) drug therapy: Secondary | ICD-10-CM | POA: Diagnosis not present

## 2023-07-04 DIAGNOSIS — R6884 Jaw pain: Secondary | ICD-10-CM | POA: Diagnosis not present

## 2023-07-04 DIAGNOSIS — M316 Other giant cell arteritis: Secondary | ICD-10-CM | POA: Diagnosis not present

## 2023-07-04 DIAGNOSIS — I2699 Other pulmonary embolism without acute cor pulmonale: Secondary | ICD-10-CM | POA: Diagnosis not present

## 2023-07-04 DIAGNOSIS — R609 Edema, unspecified: Secondary | ICD-10-CM | POA: Diagnosis not present

## 2023-07-04 DIAGNOSIS — R519 Headache, unspecified: Secondary | ICD-10-CM | POA: Diagnosis not present

## 2023-07-04 DIAGNOSIS — M810 Age-related osteoporosis without current pathological fracture: Secondary | ICD-10-CM | POA: Diagnosis not present

## 2023-07-14 DIAGNOSIS — H2513 Age-related nuclear cataract, bilateral: Secondary | ICD-10-CM | POA: Diagnosis not present

## 2023-07-14 DIAGNOSIS — H47011 Ischemic optic neuropathy, right eye: Secondary | ICD-10-CM | POA: Diagnosis not present

## 2023-07-18 DIAGNOSIS — M818 Other osteoporosis without current pathological fracture: Secondary | ICD-10-CM | POA: Diagnosis not present

## 2023-07-18 DIAGNOSIS — E7849 Other hyperlipidemia: Secondary | ICD-10-CM | POA: Diagnosis not present

## 2023-07-18 DIAGNOSIS — M316 Other giant cell arteritis: Secondary | ICD-10-CM | POA: Diagnosis not present

## 2023-07-18 DIAGNOSIS — I10 Essential (primary) hypertension: Secondary | ICD-10-CM | POA: Diagnosis not present

## 2023-07-18 DIAGNOSIS — Z6823 Body mass index (BMI) 23.0-23.9, adult: Secondary | ICD-10-CM | POA: Diagnosis not present

## 2023-07-18 DIAGNOSIS — I2699 Other pulmonary embolism without acute cor pulmonale: Secondary | ICD-10-CM | POA: Diagnosis not present

## 2023-07-26 DIAGNOSIS — M316 Other giant cell arteritis: Secondary | ICD-10-CM | POA: Diagnosis not present

## 2023-08-10 DIAGNOSIS — M316 Other giant cell arteritis: Secondary | ICD-10-CM | POA: Diagnosis not present

## 2023-08-23 DIAGNOSIS — M316 Other giant cell arteritis: Secondary | ICD-10-CM | POA: Diagnosis not present

## 2023-09-20 DIAGNOSIS — M316 Other giant cell arteritis: Secondary | ICD-10-CM | POA: Diagnosis not present

## 2023-10-17 DIAGNOSIS — Z0000 Encounter for general adult medical examination without abnormal findings: Secondary | ICD-10-CM | POA: Diagnosis not present

## 2023-10-17 DIAGNOSIS — N182 Chronic kidney disease, stage 2 (mild): Secondary | ICD-10-CM | POA: Diagnosis not present

## 2023-10-17 DIAGNOSIS — M818 Other osteoporosis without current pathological fracture: Secondary | ICD-10-CM | POA: Diagnosis not present

## 2023-10-17 DIAGNOSIS — I10 Essential (primary) hypertension: Secondary | ICD-10-CM | POA: Diagnosis not present

## 2023-10-17 DIAGNOSIS — Z6823 Body mass index (BMI) 23.0-23.9, adult: Secondary | ICD-10-CM | POA: Diagnosis not present

## 2023-10-17 DIAGNOSIS — I2699 Other pulmonary embolism without acute cor pulmonale: Secondary | ICD-10-CM | POA: Diagnosis not present

## 2023-10-17 DIAGNOSIS — M316 Other giant cell arteritis: Secondary | ICD-10-CM | POA: Diagnosis not present

## 2023-10-17 DIAGNOSIS — E7849 Other hyperlipidemia: Secondary | ICD-10-CM | POA: Diagnosis not present

## 2023-11-01 DIAGNOSIS — M316 Other giant cell arteritis: Secondary | ICD-10-CM | POA: Diagnosis not present

## 2023-11-13 DIAGNOSIS — Z6823 Body mass index (BMI) 23.0-23.9, adult: Secondary | ICD-10-CM | POA: Diagnosis not present

## 2023-11-13 DIAGNOSIS — M25511 Pain in right shoulder: Secondary | ICD-10-CM | POA: Diagnosis not present

## 2023-11-13 DIAGNOSIS — R002 Palpitations: Secondary | ICD-10-CM | POA: Diagnosis not present

## 2023-11-13 DIAGNOSIS — I10 Essential (primary) hypertension: Secondary | ICD-10-CM | POA: Diagnosis not present

## 2023-11-16 NOTE — Progress Notes (Signed)
 [facility] - Clinic Note  11/28/2023   CHIEF COMPLAINT Patient presents for Retina Follow Up  HISTORY OF PRESENT ILLNESS: Julie Melton is a 78 y.o. female who presents to the clinic today for:  HPI     Retina Follow Up   Patient presents with  Other (Disc edema).  In right eye.  Duration of 9 months.        Comments   Patient here for 9 month retina follow up for disc edema OD. Patient states no changes in vision. Pt denies any flashes or floaters. Pt states she got new glasses in November but doesn't feel they are any good. Pt denies any eye discomfort. Pt is not using any ats.      Last edited by Elicia Lamp, COT on 11/28/2023 10:46 AM.     Patient states that she is following up at [facility]. Patient states that she still has a blurry spot.  Referring physician: Toma Deiters, MD [address] [place],  [place] [address]  HISTORICAL INFORMATION:  Selected notes from the MEDICAL RECORD NUMBER Referred by Dr. Despina Arias for decreased VA LEE:  Ocular Hx- PMH-   CURRENT MEDICATIONS: No current outpatient medications on file. (Ophthalmic Drugs)   No current facility-administered medications for this visit. (Ophthalmic Drugs)   Current Outpatient Medications (Other)  Medication Sig   Cholecalciferol (QC VITAMIN D3) 50 MCG (2000 UT) TABS Take 2,000 Units by mouth in the morning.   cyanocobalamin (VITAMIN B12) 1000 MCG tablet Take 1,000 mcg by mouth in the morning.   ezetimibe (ZETIA) 10 MG tablet Take 10 mg by mouth every evening.   fluticasone (FLONASE) 50 MCG/ACT nasal spray Place into both nostrils daily.   folic acid (FOLVITE) 1 MG tablet Take 1 mg by mouth daily.   simvastatin (ZOCOR) 20 MG tablet Take 20 mg by mouth every evening.   tocilizumab in sodium chloride 0.9 % Inject into the vein. Every 4 weeks   XARELTO 20 MG TABS tablet Take 20 mg by mouth in the morning.   alendronate (FOSAMAX) 70 MG tablet Take 70 mg by mouth every Saturday.  (Patient not taking: Reported on 11/28/2023)   doxycycline (VIBRAMYCIN) 100 MG capsule Take 100 mg by mouth 2 (two) times daily. (Patient not taking: Reported on 11/28/2023)   famotidine (PEPCID) 20 MG tablet Take 20 mg by mouth 2 (two) times daily. (Patient not taking: Reported on 11/28/2023)   methylPREDNISolone (MEDROL DOSEPAK) 4 MG TBPK tablet Take by mouth. (Patient not taking: Reported on 11/28/2023)   No current facility-administered medications for this visit. (Other)   REVIEW OF SYSTEMS: ROS   Positive for: Musculoskeletal, Cardiovascular, Eyes, Heme/Lymph Negative for: Constitutional, Gastrointestinal, Neurological, Skin, Genitourinary, HENT, Endocrine, Respiratory, Psychiatric, Allergic/Imm Last edited by Elicia Lamp, COT on 11/28/2023 10:13 AM.       ALLERGIES Allergies  Allergen Reactions   Aspirin     On xarelto   PAST MEDICAL HISTORY Past Medical History:  Diagnosis Date   Cataract    Hyperlipemia    Osteopenia    Pulmonary embolism (HCC) 02/2022   Past Surgical History:  Procedure Laterality Date   BIOPSY  09/26/2022   Procedure: BIOPSY;  Surgeon: Lanelle Bal, DO;  [facility];  Service: Endoscopy;;   BREAST EXCISIONAL BIOPSY Right 2006   COLONOSCOPY WITH PROPOFOL N/A 09/26/2022   Procedure: COLONOSCOPY WITH PROPOFOL;  Surgeon: Lanelle Bal, DO;  [facility];  Service: Endoscopy;  Laterality: N/A;  11;15 AM   ESOPHAGOGASTRODUODENOSCOPY (EGD) WITH PROPOFOL N/A 09/26/2022   Procedure: ESOPHAGOGASTRODUODENOSCOPY (EGD) WITH PROPOFOL;  Surgeon: Lanelle Bal, DO;  [facility];  Service: Endoscopy;  Laterality: N/A;   POLYPECTOMY  09/26/2022   Procedure: POLYPECTOMY;  Surgeon: Lanelle Bal, DO;  [facility];  Service: Endoscopy;;   FAMILY HISTORY Family History  Problem Relation Age of Onset   Breast cancer Mother    Stroke Mother    Cataracts Sister    Cataracts Brother    Colon cancer Neg Hx     Colon polyps Neg Hx    SOCIAL HISTORY Social History   Tobacco Use   Smoking status: Never    Passive exposure: Past   Smokeless tobacco: Never  Vaping Use   Vaping status: Never Used  Substance Use Topics   Alcohol use: Never   Drug use: Never       OPHTHALMIC EXAM:  Base Eye Exam     Visual Acuity (Snellen - Linear)       Right Left   Dist cc 20/250 20/40 +1   Dist ph cc NI 20/30         Tonometry (Tonopen, 10:33 AM)       Right Left   Pressure 12 11         Pupils       Dark Light Shape React APD   Right 3 2 Round Brisk None   Left 3 2 Round Brisk None         Visual Fields       Left Right    Full    Restrictions  Total superior nasal deficiency; Partial inner superior temporal deficiency         Extraocular Movement       Right Left    Full, Ortho Full, Ortho         Neuro/Psych     Oriented x3: Yes   Mood/Affect: Normal         Dilation     Both eyes: 1.0% Mydriacyl, 2.5% Phenylephrine @ 10:33 AM           Slit Lamp and Fundus Exam     Slit Lamp Exam       Right Left   Lids/Lashes Dermatochalasis - upper lid, mild MGD, Meibomian gland dysfunction Dermatochalasis - upper lid, mild MGD   Conjunctiva/Sclera White and quiet White and quiet   Cornea tear film debris, trace PEE 1+ Punctate epithelial erosions, tear film debris   Anterior Chamber deep, clear, narrow temporal angle deep, clear, narrow temporal angle   Iris Round and dilated Round and dilated   Lens 2-3+ Nuclear sclerosis, 2-3+ Cortical cataract 2-3+ Nuclear sclerosis, 2-3+ Cortical cataract   Anterior Vitreous mild syneresis mild syneresis         Fundus Exam       Right Left   Disc Pink sharp, no edema, PPP Pink and Sharp, focal PPA   C/D Ratio 0.2 0.2   Macula Flat, Blunted foveal reflex, fine drusen, RPE mottling, No heme or edema Flat, Blunted foveal reflex, RPE mottling and clumping, No heme or edema, fine drusen   Vessels attenuated, mild  tortuosity, mild AV crossing changes attenuated, mild tortuosity, mild AV crossing changes   Periphery Attached, mild reticular degeneration, no heme Attached, mild reticular degeneration, no heme           Refraction     Wearing  Rx       Sphere Cylinder Axis Add   Right +2.25 +1.25 157 +1.50   Left +1.50 +0.75 008 +1.50    Age: 50m   Type: Progressive         Manifest Refraction (Subjective)       Sphere Cylinder Axis Dist VA   Right -0.25 +1.50 162 60-2   Left +1.75 Sphere  20/25           IMAGING AND PROCEDURES  Imaging and Procedures for 11/28/2023         ASSESSMENT/PLAN:   ICD-10-CM   1. 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 Giant cell arteritis (HCC)  M31.6     3. 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 Combined forms of age-related cataract of both eyes  H25.813       1,2. Optic disc edema -- Giant cell arteritis OD - pt reports decreased vision OD w/ +R temporal headache and R jaw pain/claudication, onset Saturday, 05.11.24.  - went to [facility] on Saturday 05.11.24 -- ESR mildly elevated - headache persisted, so pt presented to PCP on Monday, 05.13.24 -- given medrol dose pack and doxycycline - pt also saw Dr. Conley Rolls on 05.13.24 due to decreased vision OD who then referred here for further eval - exam here showed +disc edema w/ elevated ESR, temporal headache, and jaw pain/claudication -- high suspicion of GCA / temporal arteritis - from here referred urgently to [facility] ED on 05.14.24 -- diagnosed w/ GCA (+TAB), admitted and treated with IV solumedrol x3 days - completed slow taper of po prednisone - currently getting a monthly infusion of Actimera since Aug 2024 - BCVA OD 20/250 from 20/40 - exam shows interval resolution of optic disc edema - continue management per [facility] team  - f/u in 6-9 months, DFE, OCT  3. Retinal drusen OU  - monitor  4. Mixed Cataract OU - The symptoms of cataract, surgical options, and  treatments and risks were discussed with patient. - discussed diagnosis and progression - will refer to [facility] for cataract eval - monitor   Ophthalmic Meds Ordered this visit:  No orders of the defined types were placed in this encounter.    No follow-ups on file.  There are no Patient Instructions on file for this visit.  This document serves as a record of services personally performed by Karie Chimera, MD, PhD. It was created on their behalf by Charlette Caffey, COT an ophthalmic technician. The creation of this record is the provider's dictation and/or activities during the visit.    Electronically signed by:  Charlette Caffey, COT  11/28/23 11:20 AM  Karie Chimera, M.D., Ph.D. Diseases & Surgery of the Retina and Vitreous [facility]   Abbreviations: M myopia (nearsighted); A astigmatism; H hyperopia (farsighted); P presbyopia; Mrx spectacle prescription;  CTL contact lenses; OD right eye; OS left eye; OU both eyes  XT exotropia; ET esotropia; PEK punctate epithelial keratitis; PEE punctate epithelial erosions; DES dry eye syndrome; MGD meibomian gland dysfunction; ATs artificial tears; PFAT's preservative free artificial tears; NSC nuclear sclerotic cataract; PSC posterior subcapsular cataract; ERM epi-retinal membrane; PVD posterior vitreous detachment; RD retinal detachment; DM diabetes mellitus; DR diabetic retinopathy; NPDR non-proliferative diabetic retinopathy; PDR proliferative diabetic retinopathy; CSME clinically significant macular edema; DME diabetic macular edema; dbh dot blot hemorrhages; CWS cotton wool spot; POAG primary  open angle glaucoma; C/D cup-to-disc ratio; HVF humphrey visual field; GVF goldmann visual field; OCT optical coherence tomography; IOP intraocular pressure; BRVO Branch retinal vein occlusion; CRVO central retinal vein occlusion; CRAO central retinal artery occlusion; BRAO branch retinal artery occlusion; RT retinal  tear; SB scleral buckle; PPV pars plana vitrectomy; VH Vitreous hemorrhage; PRP panretinal laser photocoagulation; IVK intravitreal kenalog; VMT vitreomacular traction; MH Macular hole;  NVD neovascularization of the disc; NVE neovascularization elsewhere; AREDS age related eye disease study; ARMD age related macular degeneration; POAG primary open angle glaucoma; EBMD epithelial/anterior basement membrane dystrophy; ACIOL anterior chamber intraocular lens; IOL intraocular lens; PCIOL posterior chamber intraocular lens; Phaco/IOL phacoemulsification with intraocular lens placement; PRK photorefractive keratectomy; LASIK laser assisted in situ keratomileusis; HTN hypertension; DM diabetes mellitus; COPD chronic obstructive pulmonary disease

## 2023-11-28 ENCOUNTER — Ambulatory Visit (INDEPENDENT_AMBULATORY_CARE_PROVIDER_SITE_OTHER): Payer: Medicare Other | Admitting: Ophthalmology

## 2023-11-28 ENCOUNTER — Encounter (INDEPENDENT_AMBULATORY_CARE_PROVIDER_SITE_OTHER): Payer: Self-pay | Admitting: Ophthalmology

## 2023-11-28 DIAGNOSIS — H4710 Unspecified papilledema: Secondary | ICD-10-CM | POA: Diagnosis not present

## 2023-11-28 DIAGNOSIS — H25813 Combined forms of age-related cataract, bilateral: Secondary | ICD-10-CM

## 2023-11-28 DIAGNOSIS — H35363 Drusen (degenerative) of macula, bilateral: Secondary | ICD-10-CM | POA: Diagnosis not present

## 2023-11-28 DIAGNOSIS — M316 Other giant cell arteritis: Secondary | ICD-10-CM

## 2023-11-29 ENCOUNTER — Encounter (INDEPENDENT_AMBULATORY_CARE_PROVIDER_SITE_OTHER): Payer: Self-pay | Admitting: Ophthalmology

## 2023-11-29 DIAGNOSIS — M316 Other giant cell arteritis: Secondary | ICD-10-CM | POA: Diagnosis not present

## 2023-12-27 DIAGNOSIS — M316 Other giant cell arteritis: Secondary | ICD-10-CM | POA: Diagnosis not present

## 2024-01-08 DIAGNOSIS — H01004 Unspecified blepharitis left upper eyelid: Secondary | ICD-10-CM | POA: Diagnosis not present

## 2024-01-08 DIAGNOSIS — H3411 Central retinal artery occlusion, right eye: Secondary | ICD-10-CM | POA: Diagnosis not present

## 2024-01-08 DIAGNOSIS — H2513 Age-related nuclear cataract, bilateral: Secondary | ICD-10-CM | POA: Diagnosis not present

## 2024-01-08 DIAGNOSIS — H01002 Unspecified blepharitis right lower eyelid: Secondary | ICD-10-CM | POA: Diagnosis not present

## 2024-01-08 DIAGNOSIS — H3562 Retinal hemorrhage, left eye: Secondary | ICD-10-CM | POA: Diagnosis not present

## 2024-01-08 DIAGNOSIS — H01001 Unspecified blepharitis right upper eyelid: Secondary | ICD-10-CM | POA: Diagnosis not present

## 2024-01-08 DIAGNOSIS — H25812 Combined forms of age-related cataract, left eye: Secondary | ICD-10-CM | POA: Diagnosis not present

## 2024-01-16 DIAGNOSIS — R002 Palpitations: Secondary | ICD-10-CM | POA: Diagnosis not present

## 2024-01-16 DIAGNOSIS — Z6823 Body mass index (BMI) 23.0-23.9, adult: Secondary | ICD-10-CM | POA: Diagnosis not present

## 2024-01-16 DIAGNOSIS — N182 Chronic kidney disease, stage 2 (mild): Secondary | ICD-10-CM | POA: Diagnosis not present

## 2024-01-16 DIAGNOSIS — M818 Other osteoporosis without current pathological fracture: Secondary | ICD-10-CM | POA: Diagnosis not present

## 2024-01-16 DIAGNOSIS — K219 Gastro-esophageal reflux disease without esophagitis: Secondary | ICD-10-CM | POA: Diagnosis not present

## 2024-01-16 DIAGNOSIS — M150 Primary generalized (osteo)arthritis: Secondary | ICD-10-CM | POA: Diagnosis not present

## 2024-01-16 DIAGNOSIS — I10 Essential (primary) hypertension: Secondary | ICD-10-CM | POA: Diagnosis not present

## 2024-01-16 DIAGNOSIS — Z0000 Encounter for general adult medical examination without abnormal findings: Secondary | ICD-10-CM | POA: Diagnosis not present

## 2024-01-16 DIAGNOSIS — M25511 Pain in right shoulder: Secondary | ICD-10-CM | POA: Diagnosis not present

## 2024-01-16 DIAGNOSIS — E7849 Other hyperlipidemia: Secondary | ICD-10-CM | POA: Diagnosis not present

## 2024-01-23 DIAGNOSIS — Z79899 Other long term (current) drug therapy: Secondary | ICD-10-CM | POA: Diagnosis not present

## 2024-01-23 DIAGNOSIS — M316 Other giant cell arteritis: Secondary | ICD-10-CM | POA: Diagnosis not present

## 2024-01-24 DIAGNOSIS — I10 Essential (primary) hypertension: Secondary | ICD-10-CM | POA: Diagnosis not present

## 2024-01-24 DIAGNOSIS — N182 Chronic kidney disease, stage 2 (mild): Secondary | ICD-10-CM | POA: Diagnosis not present

## 2024-01-26 DIAGNOSIS — D649 Anemia, unspecified: Secondary | ICD-10-CM | POA: Diagnosis not present

## 2024-01-30 DIAGNOSIS — D649 Anemia, unspecified: Secondary | ICD-10-CM | POA: Diagnosis not present

## 2024-01-31 DIAGNOSIS — Z1231 Encounter for screening mammogram for malignant neoplasm of breast: Secondary | ICD-10-CM | POA: Diagnosis not present

## 2024-02-05 DIAGNOSIS — H25812 Combined forms of age-related cataract, left eye: Secondary | ICD-10-CM | POA: Diagnosis not present

## 2024-02-07 NOTE — H&P (Signed)
 Surgical History & Physical  Patient Name: Julie Melton  DOB: 05-Oct-1945  Surgery: Cataract extraction with intraocular lens implant phacoemulsification; Left Eye Surgeon: Tarri Farm MD Surgery Date: 02/12/2024 Pre-Op Date: 01/08/2024  HPI: A 63 Yr. old female patient  1. The patient is a new patient present for a Cataract Evaluation. The patient complains of difficulty when reading fine print, books, newspaper, instructions etc., which began 1 year ago. Both eyes are affected. The episode is constant. The patient describes foggy symptoms affecting their eyes/vision. The condition's severity decreased. This is negatively affecting the patient's quality of life and the patient is unable to function adequately in life with the current level of vision. HPI was performed by Tarri Farm .  Medical History: Cataracts  Stroke  Review of Systems Endocrine high cholesterol Respiratory pulmonary embolism All recorded systems are negative except as noted above.  Social Never smoked  Medication Alendronate, Diltiazem HCl (Cardizem CD), Simvastatin, Methylprednisolone , Ezetimibe, Xarelto  Sx/Procedures None  Drug Allergies  NKDA  History & Physical: Heent: cataracts NECK: supple without bruits LUNGS: lungs clear to auscultation CV: regular rate and rhythm Abdomen: soft and non-tender  Impression & Plan: Assessment: 1.  COMBINED FORMS AGE RELATED CATARACT; Left Eye (H25.812) 2.  BLEPHARITIS; Right Upper Lid, Right Lower Lid, Left Upper Lid, Left Lower Lid (H01.001, H01.002,H01.004,H01.005) 3.  DERMATOCHALASIS, no surgery; Right Upper Lid, Left Upper Lid (H02.831, H02.834) 4.  NUCLEAR SCLEROSIS AGE RELATED; Both Eyes (H25.13) 5.  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  RETINAL HEMORRHAGE; Left Eye (H35.62) 7.  BLINDNESS RIGHT EYE CATEGORY 4, NORMAL VISION LEFT EYE (H54.414A)  Plan: 1.  Cataract accounts for the patient's decreased vision. This visual  impairment is not correctable with a tolerable change in glasses or contact lenses. Cataract surgery with an implantation of a new lens should significantly improve the visual and functional status of the patient. Discussed all risks, benefits, alternatives, and potential complications. Discussed the procedures and recovery. Patient desires to have surgery. A-scan ordered and performed today for intra-ocular lens calculations. The surgery will be performed in order to improve vision for driving, reading, and for eye examinations. Recommend phacoemulsification with intra-ocular lens. Recommend Dextenza for post-operative pain and inflammation. Left Eye functional - first. Dilates well - shugarcaine by protocol.  2.  Blepharitis is present - recommend regular lid cleaning.  3.  Asymptomatic, recommend observation for now. Findings, prognosis and treatment options reviewed.  4.  Will address after left eye.  5.  From GCA. Under the care of Dr. Karyl Paget.  6.  along inferior vessels. Will observe.  7.  Monocular precautions discussed, including wearing shatterproof lenses.

## 2024-02-08 ENCOUNTER — Encounter (HOSPITAL_COMMUNITY): Payer: Self-pay

## 2024-02-08 ENCOUNTER — Encounter (HOSPITAL_COMMUNITY)
Admission: RE | Admit: 2024-02-08 | Discharge: 2024-02-08 | Disposition: A | Discharge status: Home / Self Care | Source: Ambulatory Visit | Attending: Ophthalmology | Admitting: Ophthalmology

## 2024-02-12 ENCOUNTER — Encounter (HOSPITAL_COMMUNITY)
Admission: RE | Disposition: A | Discharge status: Home / Self Care | Payer: Self-pay | Source: Home / Self Care | Attending: Ophthalmology

## 2024-02-12 ENCOUNTER — Ambulatory Visit (HOSPITAL_COMMUNITY): Admitting: Anesthesiology

## 2024-02-12 ENCOUNTER — Ambulatory Visit (HOSPITAL_COMMUNITY)
Admission: RE | Admit: 2024-02-12 | Discharge: 2024-02-12 | Disposition: A | Discharge status: Home / Self Care | Attending: Ophthalmology | Admitting: Ophthalmology

## 2024-02-12 DIAGNOSIS — H5712 Ocular pain, left eye: Secondary | ICD-10-CM | POA: Insufficient documentation

## 2024-02-12 DIAGNOSIS — H01005 Unspecified blepharitis left lower eyelid: Secondary | ICD-10-CM | POA: Diagnosis not present

## 2024-02-12 DIAGNOSIS — H01004 Unspecified blepharitis left upper eyelid: Secondary | ICD-10-CM | POA: Diagnosis not present

## 2024-02-12 DIAGNOSIS — H02831 Dermatochalasis of right upper eyelid: Secondary | ICD-10-CM | POA: Insufficient documentation

## 2024-02-12 DIAGNOSIS — H3411 Central retinal artery occlusion, right eye: Secondary | ICD-10-CM | POA: Insufficient documentation

## 2024-02-12 DIAGNOSIS — H54414A Blindness right eye category 4, normal vision left eye: Secondary | ICD-10-CM | POA: Diagnosis not present

## 2024-02-12 DIAGNOSIS — H01002 Unspecified blepharitis right lower eyelid: Secondary | ICD-10-CM | POA: Insufficient documentation

## 2024-02-12 DIAGNOSIS — H25812 Combined forms of age-related cataract, left eye: Secondary | ICD-10-CM | POA: Insufficient documentation

## 2024-02-12 DIAGNOSIS — Z8673 Personal history of transient ischemic attack (TIA), and cerebral infarction without residual deficits: Secondary | ICD-10-CM | POA: Insufficient documentation

## 2024-02-12 DIAGNOSIS — E785 Hyperlipidemia, unspecified: Secondary | ICD-10-CM

## 2024-02-12 DIAGNOSIS — H3562 Retinal hemorrhage, left eye: Secondary | ICD-10-CM | POA: Diagnosis not present

## 2024-02-12 DIAGNOSIS — H02834 Dermatochalasis of left upper eyelid: Secondary | ICD-10-CM | POA: Insufficient documentation

## 2024-02-12 DIAGNOSIS — H01001 Unspecified blepharitis right upper eyelid: Secondary | ICD-10-CM | POA: Diagnosis not present

## 2024-02-12 DIAGNOSIS — I2699 Other pulmonary embolism without acute cor pulmonale: Secondary | ICD-10-CM | POA: Diagnosis not present

## 2024-02-12 HISTORY — PX: CATARACT EXTRACTION W/PHACO: SHX586

## 2024-02-12 HISTORY — PX: INSERTION, STENT, DRUG-ELUTING, LACRIMAL CANALICULUS: SHX7453

## 2024-02-12 SURGERY — PHACOEMULSIFICATION, CATARACT, WITH IOL INSERTION
Anesthesia: Monitor Anesthesia Care | Site: Eye | Laterality: Left

## 2024-02-12 MED ORDER — POVIDONE-IODINE 5 % OP SOLN
OPHTHALMIC | Status: DC | PRN
  Administered 2024-02-12: 1 via OPHTHALMIC

## 2024-02-12 MED ORDER — TROPICAMIDE 1 % OP SOLN
1.0000 [drp] | OPHTHALMIC | Status: AC | PRN
  Administered 2024-02-12 (×3): 1 [drp] via OPHTHALMIC

## 2024-02-12 MED ORDER — DEXAMETHASONE 0.4 MG OP INST
VAGINAL_INSERT | OPHTHALMIC | Status: AC
Start: 2024-02-12 — End: ?
  Filled 2024-02-12: qty 1

## 2024-02-12 MED ORDER — BSS IO SOLN
INTRAOCULAR | Status: DC | PRN
Start: 2024-02-12 — End: 2024-02-12
  Administered 2024-02-12: 15 mL via INTRAOCULAR

## 2024-02-12 MED ORDER — SODIUM HYALURONATE 10 MG/ML IO SOLUTION
PREFILLED_SYRINGE | INTRAOCULAR | Status: DC | PRN
Start: 2024-02-12 — End: 2024-02-12
  Administered 2024-02-12: .85 mL via INTRAOCULAR

## 2024-02-12 MED ORDER — PHENYLEPHRINE HCL 2.5 % OP SOLN
1.0000 [drp] | OPHTHALMIC | Status: AC | PRN
  Administered 2024-02-12 (×3): 1 [drp] via OPHTHALMIC

## 2024-02-12 MED ORDER — SODIUM HYALURONATE 23MG/ML IO SOSY
PREFILLED_SYRINGE | INTRAOCULAR | Status: DC | PRN
  Administered 2024-02-12: .6 mL via INTRAOCULAR

## 2024-02-12 MED ORDER — DEXAMETHASONE 0.4 MG OP INST
VAGINAL_INSERT | OPHTHALMIC | Status: DC | PRN
  Administered 2024-02-12: .4 mg via OPHTHALMIC

## 2024-02-12 MED ORDER — LIDOCAINE HCL (PF) 1 % IJ SOLN
INTRAOCULAR | Status: DC | PRN
  Administered 2024-02-12: 1 mL via OPHTHALMIC

## 2024-02-12 MED ORDER — PHENYLEPHRINE-KETOROLAC 1-0.3 % IO SOLN
INTRAOCULAR | Status: DC | PRN
  Administered 2024-02-12: 500 mL via OPHTHALMIC

## 2024-02-12 MED ORDER — LACTATED RINGERS IV SOLN: INTRAVENOUS | Status: DC

## 2024-02-12 MED ORDER — MOXIFLOXACIN HCL 5 MG/ML IO SOLN
INTRAOCULAR | Status: DC | PRN
  Administered 2024-02-12: .3 mL via INTRACAMERAL

## 2024-02-12 MED ORDER — STERILE WATER FOR IRRIGATION IR SOLN
Status: DC | PRN
  Administered 2024-02-12: 25 mL

## 2024-02-12 MED ORDER — TETRACAINE HCL 0.5 % OP SOLN
1.0000 [drp] | OPHTHALMIC | Status: AC | PRN
  Administered 2024-02-12 (×3): 1 [drp] via OPHTHALMIC

## 2024-02-12 MED ORDER — LIDOCAINE HCL 3.5 % OP GEL
1.0000 | Freq: Once | OPHTHALMIC | Status: AC
  Administered 2024-02-12: 1 via OPHTHALMIC

## 2024-02-12 SURGICAL SUPPLY — 13 items
CATARACT SUITE SIGHTPATH (MISCELLANEOUS) ×1 IMPLANT
CLOTH BEACON ORANGE TIMEOUT ST (SAFETY) ×1 IMPLANT
EYE SHIELD UNIVERSAL CLEAR (GAUZE/BANDAGES/DRESSINGS) IMPLANT
FEE CATARACT SUITE SIGHTPATH (MISCELLANEOUS) ×1 IMPLANT
GLOVE BIOGEL PI IND STRL 6.5 (GLOVE) IMPLANT
GLOVE BIOGEL PI IND STRL 7.0 (GLOVE) ×2 IMPLANT
LENS IOL TECNIS EYHANCE 23.0 (Intraocular Lens) IMPLANT
NDL HYPO 18GX1.5 BLUNT FILL (NEEDLE) ×1 IMPLANT
NEEDLE HYPO 18GX1.5 BLUNT FILL (NEEDLE) ×1 IMPLANT
PAD ARMBOARD POSITIONER FOAM (MISCELLANEOUS) ×1 IMPLANT
SYR TB 1ML LL NO SAFETY (SYRINGE) ×1 IMPLANT
TAPE SURG TRANSPORE 1 IN (GAUZE/BANDAGES/DRESSINGS) IMPLANT
WATER STERILE IRR 250ML POUR (IV SOLUTION) ×1 IMPLANT

## 2024-02-12 NOTE — Anesthesia Preprocedure Evaluation (Signed)
 Anesthesia Evaluation  Patient identified by MRN, date of birth, ID band Patient awake    Reviewed: Allergy & Precautions, H&P , NPO status , Patient's Chart, lab work & pertinent test results  History of Anesthesia Complications Negative for: history of anesthetic complications  Airway Mallampati: II  TM Distance: >3 FB Neck ROM: Full    Dental  (+) Dental Advisory Given, Caps Crowns :   Pulmonary PE   Pulmonary exam normal breath sounds clear to auscultation       Cardiovascular Normal cardiovascular exam Rhythm:Regular Rate:Normal     Neuro/Psych negative neurological ROS  negative psych ROS   GI/Hepatic negative GI ROS, Neg liver ROS,,,  Endo/Other  negative endocrine ROS    Renal/GU negative Renal ROS  negative genitourinary   Musculoskeletal negative musculoskeletal ROS (+)    Abdominal   Peds negative pediatric ROS (+)  Hematology negative hematology ROS (+)   Anesthesia Other Findings   Reproductive/Obstetrics negative OB ROS                             Anesthesia Physical Anesthesia Plan  ASA: 3  Anesthesia Plan: MAC   Post-op Pain Management: Minimal or no pain anticipated   Induction: Intravenous  PONV Risk Score and Plan:   Airway Management Planned: Nasal Cannula and Natural Airway  Additional Equipment: None  Intra-op Plan:   Post-operative Plan:   Informed Consent: I have reviewed the patients History and Physical, chart, labs and discussed the procedure including the risks, benefits and alternatives for the proposed anesthesia with the patient or authorized representative who has indicated his/her understanding and acceptance.     Dental advisory given  Plan Discussed with: CRNA  Anesthesia Plan Comments:         Anesthesia Quick Evaluation

## 2024-02-12 NOTE — Op Note (Signed)
 Date of procedure: 02/12/24  Pre-operative diagnosis: Visually significant age-related combined cataract, Left Eye (H25.812)  Post-operative diagnosis:  Visually significant age-related combined cataract, Left Eye (H25.812) 2.   Pain and inflammation following cataract surgery, Left Eye (H57.12)  Procedure:  Removal of cataract via phacoemulsification and insertion of intra-ocular lens [employer] DIB00 +23.0D into the capsular bag of the Left Eye 2. Placement of Dextenza Implant, Left Lower Lid  Attending surgeon: Pleas Brill. Davionna Blacksher, MD, MA  Anesthesia: MAC, Topical Akten  Complications: None  Estimated Blood Loss: <61mL (minimal)  Specimens: None  Implants: As above  Indications:  Visually significant age-related cataract, Left Eye  Procedure:  The patient was seen and identified in the pre-operative area. The operative eye was identified and dilated.  The operative eye was marked.  Topical anesthesia was administered to the operative eye.     The patient was then to the operative suite and placed in the supine position.  A timeout was performed confirming the patient, procedure to be performed, and all other relevant information.   The patient's face was prepped and draped in the usual fashion for intra-ocular surgery.  A lid speculum was placed into the operative eye and the surgical microscope moved into place and focused.  An inferotemporal paracentesis was created using a 20 gauge paracentesis blade. Omidria was injected into the anterior chamber. Shugarcaine was injected into the anterior chamber.  Viscoelastic was injected into the anterior chamber.  A temporal clear-corneal main wound incision was created using a 2.35mm microkeratome.  A continuous curvilinear capsulorrhexis was initiated using an irrigating cystitome and completed using capsulorrhexis forceps.  Hydrodissection and hydrodeliniation were performed.  Viscoelastic was injected into the anterior chamber.  A  phacoemulsification handpiece and a chopper as a second instrument were used to remove the nucleus and epinucleus. The irrigation/aspiration handpiece was used to remove any remaining cortical material.   The capsular bag was reinflated with viscoelastic, checked, and found to be intact.  The intraocular lens was inserted into the capsular bag.  The irrigation/aspiration handpiece was used to remove any remaining viscoelastic.  The clear corneal wound and paracentesis wounds were then hydrated and checked with Weck-Cels to be watertight.  0.1mL of moxifloxacin was injected into the anterior chamber.  The lid-speculum was removed. The lower punctum was dilated. A Dextenza implant was placed in the lower canaliculus without complication.   The drape was removed.  The patient's face was cleaned with a wet and dry 4x4.   A clear shield was taped over the eye. The patient was taken to the post-operative care unit in good condition, having tolerated the procedure well.  Post-Op Instructions: The patient will follow up at [facility] for a same day post-operative evaluation and will receive all other orders and instructions.

## 2024-02-12 NOTE — Discharge Instructions (Addendum)
 Please discharge patient when stable, will follow up today with Dr. June Leap at the [facility] office immediately following discharge.  Leave shield in place until visit.  All paperwork with discharge instructions will be given at the office.  [facility] Address:  [address]  [place], [place] [address]

## 2024-02-12 NOTE — Anesthesia Postprocedure Evaluation (Signed)
 Anesthesia Post Note  Patient: Srinidhi Landers  Procedure(s) Performed: PHACOEMULSIFICATION, CATARACT, WITH IOL INSERTION (Left: Eye) INSERTION, STENT, DRUG-ELUTING, LACRIMAL CANALICULUS (Left: Eye)  Patient location during evaluation: PACU Anesthesia Type: MAC Level of consciousness: awake and alert Pain management: pain level controlled Vital Signs Assessment: post-procedure vital signs reviewed and stable Respiratory status: spontaneous breathing, nonlabored ventilation, respiratory function stable and patient connected to nasal cannula oxygen Cardiovascular status: stable and blood pressure returned to baseline Postop Assessment: no apparent nausea or vomiting Anesthetic complications: no   There were no known notable events for this encounter.   Last Vitals:  Vitals:   02/12/24 1245 02/12/24 1414  BP:  (!) 114/49  Pulse: 79 82  Resp: (!) 21 20  Temp:  36.7 C  SpO2: 100% 100%    Last Pain:  Vitals:   02/12/24 1414  TempSrc: Oral  PainSc: 0-No pain                 Brenin Heidelberger L Aldena Worm

## 2024-02-12 NOTE — Interval H&P Note (Signed)
 History and Physical Interval Note:  02/12/2024 1:40 PM  Julie Melton  has presented today for surgery, with the diagnosis of combined forms age related cataract, left eye.  The various methods of treatment have been discussed with the patient and family. After consideration of risks, benefits and other options for treatment, the patient has consented to  Procedure(s) with comments: PHACOEMULSIFICATION, CATARACT, WITH IOL INSERTION (Left) - CDE: INSERTION, STENT, DRUG-ELUTING, LACRIMAL CANALICULUS (Left) as a surgical intervention.  The patient's history has been reviewed, patient examined, no change in status, stable for surgery.  I have reviewed the patient's chart and labs.  Questions were answered to the patient's satisfaction.     Tarri Farm

## 2024-02-12 NOTE — Transfer of Care (Signed)
 Immediate Anesthesia Transfer of Care Note  Patient: Julie Melton  Procedure(s) Performed: PHACOEMULSIFICATION, CATARACT, WITH IOL INSERTION (Left: Eye) INSERTION, STENT, DRUG-ELUTING, LACRIMAL CANALICULUS (Left: Eye)  Patient Location: Short Stay  Anesthesia Type:MAC  Level of Consciousness: awake, alert , and oriented  Airway & Oxygen Therapy: Patient Spontanous Breathing  Post-op Assessment: Report given to RN and Post -op Vital signs reviewed and stable  Post vital signs: Reviewed and stable  Last Vitals:  Vitals Value Taken Time  BP    Temp    Pulse    Resp    SpO2      Last Pain:  Vitals:   02/12/24 1230  PainSc: 0-No pain         Complications: No notable events documented.

## 2024-02-13 ENCOUNTER — Encounter (HOSPITAL_COMMUNITY): Payer: Self-pay | Admitting: Ophthalmology

## 2024-02-20 DIAGNOSIS — Z6823 Body mass index (BMI) 23.0-23.9, adult: Secondary | ICD-10-CM | POA: Diagnosis not present

## 2024-02-20 DIAGNOSIS — M316 Other giant cell arteritis: Secondary | ICD-10-CM | POA: Diagnosis not present

## 2024-03-25 DIAGNOSIS — H524 Presbyopia: Secondary | ICD-10-CM | POA: Diagnosis not present

## 2024-03-26 DIAGNOSIS — Z780 Asymptomatic menopausal state: Secondary | ICD-10-CM | POA: Diagnosis not present

## 2024-03-26 DIAGNOSIS — M810 Age-related osteoporosis without current pathological fracture: Secondary | ICD-10-CM | POA: Diagnosis not present

## 2024-04-16 DIAGNOSIS — N182 Chronic kidney disease, stage 2 (mild): Secondary | ICD-10-CM | POA: Diagnosis not present

## 2024-04-16 DIAGNOSIS — I10 Essential (primary) hypertension: Secondary | ICD-10-CM | POA: Diagnosis not present

## 2024-04-16 DIAGNOSIS — M818 Other osteoporosis without current pathological fracture: Secondary | ICD-10-CM | POA: Diagnosis not present

## 2024-04-16 DIAGNOSIS — K219 Gastro-esophageal reflux disease without esophagitis: Secondary | ICD-10-CM | POA: Diagnosis not present

## 2024-04-16 DIAGNOSIS — Z6823 Body mass index (BMI) 23.0-23.9, adult: Secondary | ICD-10-CM | POA: Diagnosis not present

## 2024-04-16 DIAGNOSIS — M316 Other giant cell arteritis: Secondary | ICD-10-CM | POA: Diagnosis not present

## 2024-04-16 DIAGNOSIS — E7849 Other hyperlipidemia: Secondary | ICD-10-CM | POA: Diagnosis not present

## 2024-04-16 DIAGNOSIS — I2699 Other pulmonary embolism without acute cor pulmonale: Secondary | ICD-10-CM | POA: Diagnosis not present

## 2024-04-18 DIAGNOSIS — K869 Disease of pancreas, unspecified: Secondary | ICD-10-CM | POA: Diagnosis not present

## 2024-04-18 DIAGNOSIS — K7469 Other cirrhosis of liver: Secondary | ICD-10-CM | POA: Diagnosis not present

## 2024-04-18 DIAGNOSIS — K7460 Unspecified cirrhosis of liver: Secondary | ICD-10-CM | POA: Diagnosis not present

## 2024-07-30 NOTE — Progress Notes (Shared)
 [facility] - Clinic Note  08/13/2024   CHIEF COMPLAINT Patient presents for No chief complaint on file.  HISTORY OF PRESENT ILLNESS: Julie Melton is a 78 y.o. female who presents to the clinic today for:    Patient states that she is following up at [facility] for GCA. Patient states that she still has a blurry spot.  Referring physician: Orpha Yancey LABOR, MD [address] [place],  [place] [address]  HISTORICAL INFORMATION:  Selected notes from the MEDICAL RECORD NUMBER Referred by Dr. Ross Daniels for decreased VA LEE:  Ocular Hx- PMH-   CURRENT MEDICATIONS: No current outpatient medications on file. (Ophthalmic Drugs)   No current facility-administered medications for this visit. (Ophthalmic Drugs)   Current Outpatient Medications (Other)  Medication Sig   Cholecalciferol (QC VITAMIN D3) 50 MCG (2000 UT) TABS Take 2,000 Units by mouth in the morning.   cyanocobalamin  (VITAMIN B12) 1000 MCG tablet Take 1,000 mcg by mouth in the morning.   ezetimibe (ZETIA) 10 MG tablet Take 10 mg by mouth every evening.   famotidine (PEPCID) 20 MG tablet Take 20 mg by mouth 2 (two) times daily. (Patient not taking: Reported on 11/28/2023)   fluticasone (FLONASE) 50 MCG/ACT nasal spray Place into both nostrils daily.   folic acid  (FOLVITE ) 1 MG tablet Take 1 mg by mouth daily.   simvastatin (ZOCOR) 20 MG tablet Take 20 mg by mouth every evening.   tocilizumab  in sodium chloride 0.9 % Inject into the vein. Every 4 weeks   XARELTO 20 MG TABS tablet Take 20 mg by mouth in the morning.   No current facility-administered medications for this visit. (Other)   REVIEW OF SYSTEMS:     ALLERGIES Allergies  Allergen Reactions   Aspirin     On xarelto   PAST MEDICAL HISTORY Past Medical History:  Diagnosis Date   Cataract    Hyperlipemia    Osteopenia    Pulmonary embolism (HCC) 02/2022   Past Surgical History:  Procedure Laterality Date   BIOPSY  09/26/2022    Procedure: BIOPSY;  Surgeon: Cindie Carlin POUR, DO;  Location: [facility];  Service: Endoscopy;;   BREAST EXCISIONAL BIOPSY Right 2006   CATARACT EXTRACTION W/PHACO Left 02/12/2024   Procedure: PHACOEMULSIFICATION, CATARACT, WITH IOL INSERTION;  Surgeon: Harrie Agent, MD;  Location: [facility];  Service: Ophthalmology;  Laterality: Left;  CDE: 10.46   COLONOSCOPY WITH PROPOFOL  N/A 09/26/2022   Procedure: COLONOSCOPY WITH PROPOFOL ;  Surgeon: Cindie Carlin POUR, DO;  Location: [facility];  Service: Endoscopy;  Laterality: N/A;  11;15 AM   ESOPHAGOGASTRODUODENOSCOPY (EGD) WITH PROPOFOL  N/A 09/26/2022   Procedure: ESOPHAGOGASTRODUODENOSCOPY (EGD) WITH PROPOFOL ;  Surgeon: Cindie Carlin POUR, DO;  Location: [facility];  Service: Endoscopy;  Laterality: N/A;   INSERTION, STENT, DRUG-ELUTING, LACRIMAL CANALICULUS Left 02/12/2024   Procedure: INSERTION, STENT, DRUG-ELUTING, LACRIMAL CANALICULUS;  Surgeon: Harrie Agent, MD;  Location: [facility];  Service: Ophthalmology;  Laterality: Left;   POLYPECTOMY  09/26/2022   Procedure: POLYPECTOMY;  Surgeon: Cindie Carlin POUR, DO;  Location: [facility];  Service: Endoscopy;;   FAMILY HISTORY Family History  Problem Relation Age of Onset   Breast cancer Mother    Stroke Mother    Cataracts Sister    Cataracts Brother    Colon cancer Neg Hx    Colon polyps Neg Hx    SOCIAL HISTORY Social History   Tobacco Use   Smoking status: Never    Passive  exposure: Past   Smokeless tobacco: Never  Vaping Use   Vaping status: Never Used  Substance Use Topics   Alcohol use: Never   Drug use: Never       OPHTHALMIC EXAM:  Not recorded    IMAGING AND PROCEDURES  Imaging and Procedures for 08/13/2024         ASSESSMENT/PLAN: No diagnosis found.   1,2. 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  - went to [facility] on Saturday  05.11.24 -- ESR mildly elevated - headache persisted, so pt presented to PCP on Monday, 05.13.24 -- given medrol  dose pack and doxycycline - pt also saw Dr. Ladora on 05.13.24 due to decreased vision OD who then referred here for further eval - exam here showed +disc edema w/ elevated ESR, temporal headache, and jaw pain/claudication -- high suspicion of GCA / temporal arteritis - from here referred urgently to [facility] ED on 05.14.24 -- diagnosed w/ GCA (+TAB), admitted and treated with IV solumedrol x3 days - completed slow taper of po prednisone - currently getting a monthly infusion of Actimera since Aug 2024 - BCVA OD 20/250 from 20/40 - exam shows interval resolution of optic disc edema - continue management per [facility] team  - f/u in 6-9 months, DFE, OCT  3. Retinal drusen OU  - monitor  4. Mixed Cataract OU - The symptoms of cataract, surgical options, and treatments and risks were discussed with patient. - discussed diagnosis and progression - will refer to [facility] team in [place] for cataract eval  Ophthalmic Meds Ordered this visit:  No orders of the defined types were placed in this encounter.    No follow-ups on file.  There are no Patient Instructions on file for this visit.  This document serves as a record of services personally performed by Redell JUDITHANN Hans, MD, PhD. It was created on their behalf by Wanda GEANNIE Keens, COT an ophthalmic technician. The creation of this record is the provider's dictation and/or activities during the visit.    Electronically signed by:  Wanda GEANNIE Keens, COT  07/30/24 7:40 AM  Redell JUDITHANN Hans, M.D., Ph.D. Diseases & Surgery of the Retina and Vitreous [facility]   Abbreviations: M myopia (nearsighted); A astigmatism; H hyperopia (farsighted); P presbyopia; Mrx spectacle prescription;  CTL contact lenses; OD right eye; OS left eye; OU both eyes  XT exotropia; ET esotropia; PEK punctate epithelial  keratitis; PEE punctate epithelial erosions; DES dry eye syndrome; MGD meibomian gland dysfunction; ATs artificial tears; PFAT's preservative free artificial tears; NSC nuclear sclerotic cataract; PSC posterior subcapsular cataract; ERM epi-retinal membrane; PVD posterior vitreous detachment; RD retinal detachment; DM diabetes mellitus; DR diabetic retinopathy; NPDR non-proliferative diabetic retinopathy; PDR proliferative diabetic retinopathy; CSME clinically significant macular edema; DME diabetic macular edema; dbh dot blot hemorrhages; CWS cotton wool spot; POAG primary open angle glaucoma; C/D cup-to-disc ratio; HVF humphrey visual field; GVF goldmann visual field; OCT optical coherence tomography; IOP intraocular pressure; BRVO Branch retinal vein occlusion; CRVO central retinal vein occlusion; CRAO central retinal artery occlusion; BRAO branch retinal artery occlusion; RT retinal tear; SB scleral buckle; PPV pars plana vitrectomy; VH Vitreous hemorrhage; PRP panretinal laser photocoagulation; IVK intravitreal kenalog; VMT vitreomacular traction; MH Macular hole;  NVD neovascularization of the disc; NVE neovascularization elsewhere; AREDS age related eye disease study; ARMD age related macular degeneration; POAG primary open  angle glaucoma; EBMD epithelial/anterior basement membrane dystrophy; ACIOL anterior chamber intraocular lens; IOL intraocular lens; PCIOL posterior chamber intraocular lens; Phaco/IOL phacoemulsification with intraocular lens placement; PRK photorefractive keratectomy; LASIK laser assisted in situ keratomileusis; HTN hypertension; DM diabetes mellitus; COPD chronic obstructive pulmonary disease

## 2024-08-13 ENCOUNTER — Encounter (INDEPENDENT_AMBULATORY_CARE_PROVIDER_SITE_OTHER): Admitting: Ophthalmology

## 2024-08-13 DIAGNOSIS — H35363 Drusen (degenerative) of macula, bilateral: Secondary | ICD-10-CM

## 2024-08-13 DIAGNOSIS — M316 Other giant cell arteritis: Secondary | ICD-10-CM

## 2024-08-13 DIAGNOSIS — H4710 Unspecified papilledema: Secondary | ICD-10-CM

## 2024-08-13 DIAGNOSIS — H25813 Combined forms of age-related cataract, bilateral: Secondary | ICD-10-CM

## 2024-08-19 ENCOUNTER — Encounter (HOSPITAL_COMMUNITY): Payer: Self-pay | Admitting: Nurse Practitioner

## 2024-08-19 ENCOUNTER — Other Ambulatory Visit: Payer: Self-pay | Admitting: Nurse Practitioner

## 2024-08-19 ENCOUNTER — Other Ambulatory Visit (HOSPITAL_COMMUNITY): Payer: Self-pay | Admitting: Nurse Practitioner

## 2024-08-19 DIAGNOSIS — K7469 Other cirrhosis of liver: Secondary | ICD-10-CM

## 2024-10-11 ENCOUNTER — Ambulatory Visit (HOSPITAL_COMMUNITY): Admission: RE | Admit: 2024-10-11 | Source: Ambulatory Visit

## 2024-10-11 DIAGNOSIS — K7469 Other cirrhosis of liver: Secondary | ICD-10-CM
